# Patient Record
Sex: Male | Born: 1995 | Race: White | Hispanic: No | Marital: Single | Smoking: Never smoker
Health system: Southern US, Community
[De-identification: ages and names within clinical notes are randomized; demographics above are authoritative.]

## PROBLEM LIST (undated history)

## (undated) DIAGNOSIS — F840 Autistic disorder: Secondary | ICD-10-CM

---

## 2018-07-16 ENCOUNTER — Inpatient Hospital Stay (HOSPITAL_COMMUNITY)
Admission: EM | Admit: 2018-07-16 | Discharge: 2018-07-20 | DRG: 071 | Disposition: A | Discharge status: Home / Self Care | Payer: Medicare Other | Attending: Internal Medicine | Admitting: Internal Medicine

## 2018-07-16 ENCOUNTER — Observation Stay (HOSPITAL_COMMUNITY): Payer: Medicare Other

## 2018-07-16 ENCOUNTER — Emergency Department (HOSPITAL_COMMUNITY): Payer: Medicare Other

## 2018-07-16 ENCOUNTER — Encounter (HOSPITAL_COMMUNITY): Payer: Self-pay

## 2018-07-16 ENCOUNTER — Other Ambulatory Visit: Payer: Self-pay

## 2018-07-16 DIAGNOSIS — Q02 Microcephaly: Secondary | ICD-10-CM

## 2018-07-16 DIAGNOSIS — R1310 Dysphagia, unspecified: Secondary | ICD-10-CM | POA: Diagnosis present

## 2018-07-16 DIAGNOSIS — G9341 Metabolic encephalopathy: Principal | ICD-10-CM | POA: Diagnosis present

## 2018-07-16 DIAGNOSIS — R740 Nonspecific elevation of levels of transaminase and lactic acid dehydrogenase [LDH]: Secondary | ICD-10-CM

## 2018-07-16 DIAGNOSIS — J45909 Unspecified asthma, uncomplicated: Secondary | ICD-10-CM | POA: Diagnosis present

## 2018-07-16 DIAGNOSIS — R0600 Dyspnea, unspecified: Secondary | ICD-10-CM | POA: Diagnosis not present

## 2018-07-16 DIAGNOSIS — K760 Fatty (change of) liver, not elsewhere classified: Secondary | ICD-10-CM | POA: Diagnosis present

## 2018-07-16 DIAGNOSIS — R1084 Generalized abdominal pain: Secondary | ICD-10-CM

## 2018-07-16 DIAGNOSIS — R4182 Altered mental status, unspecified: Secondary | ICD-10-CM

## 2018-07-16 DIAGNOSIS — E039 Hypothyroidism, unspecified: Secondary | ICD-10-CM | POA: Diagnosis present

## 2018-07-16 DIAGNOSIS — F909 Attention-deficit hyperactivity disorder, unspecified type: Secondary | ICD-10-CM | POA: Diagnosis present

## 2018-07-16 DIAGNOSIS — R945 Abnormal results of liver function studies: Secondary | ICD-10-CM

## 2018-07-16 DIAGNOSIS — E538 Deficiency of other specified B group vitamins: Secondary | ICD-10-CM | POA: Diagnosis present

## 2018-07-16 DIAGNOSIS — F840 Autistic disorder: Secondary | ICD-10-CM | POA: Diagnosis present

## 2018-07-16 DIAGNOSIS — R7401 Elevation of levels of liver transaminase levels: Secondary | ICD-10-CM

## 2018-07-16 DIAGNOSIS — K5900 Constipation, unspecified: Secondary | ICD-10-CM | POA: Diagnosis present

## 2018-07-16 DIAGNOSIS — F419 Anxiety disorder, unspecified: Secondary | ICD-10-CM | POA: Diagnosis present

## 2018-07-16 DIAGNOSIS — D72829 Elevated white blood cell count, unspecified: Secondary | ICD-10-CM | POA: Diagnosis present

## 2018-07-16 DIAGNOSIS — F329 Major depressive disorder, single episode, unspecified: Secondary | ICD-10-CM | POA: Diagnosis present

## 2018-07-16 DIAGNOSIS — Z79899 Other long term (current) drug therapy: Secondary | ICD-10-CM

## 2018-07-16 DIAGNOSIS — R7989 Other specified abnormal findings of blood chemistry: Secondary | ICD-10-CM

## 2018-07-16 DIAGNOSIS — R109 Unspecified abdominal pain: Secondary | ICD-10-CM

## 2018-07-16 HISTORY — DX: Autistic disorder: F84.0

## 2018-07-16 LAB — CBC WITH DIFFERENTIAL/PLATELET
BASOS PCT: 0 %
Basophils Absolute: 0.1 10*3/uL (ref 0.0–0.1)
EOS ABS: 0 10*3/uL (ref 0.0–0.7)
EOS PCT: 0 %
HCT: 49.5 % (ref 39.0–52.0)
HEMOGLOBIN: 17 g/dL (ref 13.0–17.0)
Lymphocytes Relative: 25 %
Lymphs Abs: 3.3 10*3/uL (ref 0.7–4.0)
MCH: 31.8 pg (ref 26.0–34.0)
MCHC: 34.3 g/dL (ref 30.0–36.0)
MCV: 92.5 fL (ref 78.0–100.0)
MONO ABS: 0.8 10*3/uL (ref 0.1–1.0)
MONOS PCT: 6 %
NEUTROS PCT: 69 %
Neutro Abs: 9.2 10*3/uL — ABNORMAL HIGH (ref 1.7–7.7)
PLATELETS: 255 10*3/uL (ref 150–400)
RBC: 5.35 MIL/uL (ref 4.22–5.81)
RDW: 12.3 % (ref 11.5–15.5)
WBC: 13.4 10*3/uL — ABNORMAL HIGH (ref 4.0–10.5)

## 2018-07-16 LAB — COMPREHENSIVE METABOLIC PANEL
ALBUMIN: 4.5 g/dL (ref 3.5–5.0)
ALT: 106 U/L — ABNORMAL HIGH (ref 0–44)
ANION GAP: 12 (ref 5–15)
AST: 69 U/L — ABNORMAL HIGH (ref 15–41)
Alkaline Phosphatase: 63 U/L (ref 38–126)
BUN: 10 mg/dL (ref 6–20)
CHLORIDE: 108 mmol/L (ref 98–111)
CO2: 22 mmol/L (ref 22–32)
Calcium: 9.6 mg/dL (ref 8.9–10.3)
Creatinine, Ser: 0.95 mg/dL (ref 0.61–1.24)
GFR calc non Af Amer: >60 mL/min (ref >60)
GLUCOSE: 86 mg/dL (ref 70–99)
POTASSIUM: 3.7 mmol/L (ref 3.5–5.1)
SODIUM: 142 mmol/L (ref 135–145)
Total Bilirubin: 0.9 mg/dL (ref 0.3–1.2)
Total Protein: 8.3 g/dL — ABNORMAL HIGH (ref 6.5–8.1)

## 2018-07-16 LAB — URINALYSIS, ROUTINE W REFLEX MICROSCOPIC
Bilirubin Urine: NEGATIVE
Glucose, UA: NEGATIVE mg/dL
Hgb urine dipstick: NEGATIVE
KETONES UR: 5 mg/dL — AB
Leukocytes, UA: NEGATIVE
NITRITE: NEGATIVE
PH: 7 (ref 5.0–8.0)
PROTEIN: NEGATIVE mg/dL
Specific Gravity, Urine: 1.038 — ABNORMAL HIGH (ref 1.005–1.030)

## 2018-07-16 LAB — RAPID URINE DRUG SCREEN, HOSP PERFORMED
Amphetamines: NOT DETECTED
BENZODIAZEPINES: NOT DETECTED
Barbiturates: NOT DETECTED
COCAINE: NOT DETECTED
OPIATES: NOT DETECTED
TETRAHYDROCANNABINOL: NOT DETECTED

## 2018-07-16 LAB — TSH: TSH: 5.457 u[IU]/mL — AB (ref 0.350–4.500)

## 2018-07-16 LAB — VITAMIN B12: Vitamin B-12: 305 pg/mL (ref 180–914)

## 2018-07-16 LAB — LIPASE, BLOOD: Lipase: 27 U/L (ref 11–51)

## 2018-07-16 LAB — ETHANOL: Alcohol, Ethyl (B): <10 mg/dL (ref <10)

## 2018-07-16 LAB — AMMONIA: Ammonia: 12 umol/L (ref 9–35)

## 2018-07-16 MED ORDER — IOPAMIDOL (ISOVUE-300) INJECTION 61%
100.0000 mL | Freq: Once | INTRAVENOUS | Status: AC | PRN
  Administered 2018-07-16: 100 mL via INTRAVENOUS

## 2018-07-16 MED ORDER — FLUVOXAMINE MALEATE 50 MG PO TABS
50.0000 mg | ORAL_TABLET | Freq: Every day | ORAL | Status: DC
  Administered 2018-07-17 – 2018-07-20 (×4): 50 mg via ORAL
  Filled 2018-07-16 (×5): qty 1

## 2018-07-16 MED ORDER — GUANFACINE HCL ER 1 MG PO TB24
4.0000 mg | ORAL_TABLET | Freq: Every day | ORAL | Status: DC
  Administered 2018-07-17 – 2018-07-20 (×4): 4 mg via ORAL
  Filled 2018-07-16 (×5): qty 4

## 2018-07-16 MED ORDER — ARIPIPRAZOLE 5 MG PO TABS: 5.0000 mg | ORAL_TABLET | Freq: Every evening | ORAL | Status: DC

## 2018-07-16 MED ORDER — CYANOCOBALAMIN 1000 MCG/ML IJ SOLN
1000.0000 ug | Freq: Once | INTRAMUSCULAR | Status: AC
  Administered 2018-07-17: 1000 ug via INTRAMUSCULAR
  Filled 2018-07-16: qty 1

## 2018-07-16 MED ORDER — IOPAMIDOL (ISOVUE-300) INJECTION 61%
INTRAVENOUS | Status: AC
  Filled 2018-07-16: qty 100

## 2018-07-16 MED ORDER — VITAMIN B-12 1000 MCG PO TABS
2000.0000 ug | ORAL_TABLET | Freq: Every day | ORAL | Status: DC
  Administered 2018-07-17 – 2018-07-20 (×4): 2000 ug via ORAL
  Filled 2018-07-16 (×5): qty 2

## 2018-07-16 MED ORDER — SODIUM CHLORIDE 0.9 % IV BOLUS
1000.0000 mL | Freq: Once | INTRAVENOUS | Status: AC
  Administered 2018-07-16: 1000 mL via INTRAVENOUS

## 2018-07-16 NOTE — ED Notes (Signed)
Bed: WA17 Expected date:  Expected time:  Means of arrival:  Comments: Hold for TCU pt

## 2018-07-16 NOTE — ED Notes (Signed)
IV Ultrasound requested, Charge RN at bedside.

## 2018-07-16 NOTE — ED Provider Notes (Addendum)
[facility]-EMERGENCY DEPT Provider Note   CSN: [id number] Arrival date & time: 07/16/18  1508     History   Chief Complaint Chief Complaint  Patient presents with  . Constipation    HPI Nicholas Beard is a 22 y.o. male who presents AMS. PMH significant for Autism, absence seizures. He is with a caretaker at his group home and she says that he has been with them for about a year and he is typically very high functioning and is talkative. This morning he was acutely altered. His psych medicine was changed from Rexulti to Abilify in early June. He has had a decreased appetite since then. Yesterday and today he has become less responsive and wasn't answering staff's questions which he usually can do. The patient states that he feels "backed up". He denies hx of constipation or prior abdominal surgeries. He states his head hurts too. No known fevers. EMS was concerned for rectal FB but also noted his "eyes were jumping". His grandmother is POA.  LEVEL 5 CAVEAT due to AMS.  HPI  History reviewed. No pertinent past medical history.  There are no active problems to display for this patient.   History reviewed. No pertinent surgical history.      Home Medications    Prior to Admission medications   Medication Sig Start Date End Date Taking? Authorizing Provider  ARIPiprazole (ABILIFY) 5 MG tablet Take 5 mg by mouth every evening.   Yes [provider]  fluvoxaMINE (LUVOX) 50 MG tablet Take 50 mg by mouth daily.   Yes [provider]  guanFACINE (INTUNIV) 4 MG TB24 ER tablet Take 4 mg by mouth daily.   Yes [provider]    Family History History reviewed. No pertinent family history.  Social History Social History   Tobacco Use  . Smoking status: Never Smoker  . Smokeless tobacco: Never Used  Substance Use Topics  . Alcohol use: Not on file  . Drug use: Not on file     Allergies   Other   Review of Systems Review  of Systems  Unable to perform ROS: Mental status change  Gastrointestinal: Positive for abdominal pain and constipation.     Physical Exam Updated Vital Signs BP (!) 147/91   Pulse 91   Temp 98.4 F (36.9 C) (Oral)   Resp 18   SpO2 98%   Physical Exam  Constitutional: He is oriented to person, place, and time. He appears well-developed and well-nourished. No distress.  Obese, answers yes or no questions  HENT:  Head: Normocephalic and atraumatic.  Eyes: Pupils are equal, round, and reactive to light. Conjunctivae are normal. Right eye exhibits no discharge. Left eye exhibits no discharge. No scleral icterus.  Neck: Normal range of motion.  Cardiovascular: Normal rate and regular rhythm.  Pulmonary/Chest: Effort normal and breath sounds normal. No respiratory distress.  Abdominal: Soft. Bowel sounds are normal. He exhibits no distension. There is no tenderness.  Neurological: He is alert and oriented to person, place, and time.  Skin: Skin is warm and dry.  Psychiatric: He has a normal mood and affect. His behavior is normal.  Nursing note and vitals reviewed.    ED Treatments / Results  Labs (all labs ordered are listed, but only abnormal results are displayed) Labs Reviewed  CBC WITH DIFFERENTIAL/PLATELET - Abnormal; Notable for the following components:      Result Value   WBC 13.4 (*)    Neutro Abs 9.2 (*)  All other components within normal limits  COMPREHENSIVE METABOLIC PANEL - Abnormal; Notable for the following components:   Total Protein 8.3 (*)    AST 69 (*)    ALT 106 (*)    All other components within normal limits  URINALYSIS, ROUTINE W REFLEX MICROSCOPIC - Abnormal; Notable for the following components:   Specific Gravity, Urine 1.038 (*)    Ketones, ur 5 (*)    All other components within normal limits  LIPASE, BLOOD  ETHANOL  AMMONIA  RAPID URINE DRUG SCREEN, HOSP PERFORMED    EKG None  Radiology Ct Head Wo Contrast  Result Date:  07/16/2018 CLINICAL DATA:  Altered level of consciousness.  Autism EXAM: CT HEAD WITHOUT CONTRAST TECHNIQUE: Contiguous axial images were obtained from the base of the skull through the vertex without intravenous contrast. COMPARISON:  None. FINDINGS: Brain: The ventricles are normal in size and configuration. There is no intracranial mass, hemorrhage, extra-axial fluid collection, or midline shift. Gray-white compartments appear normal. No acute infarct is evident. Vascular: No hyperdense vessel. No vascular calcifications are evident. Skull: The bony calvarium appears intact. Sinuses/Orbits: There is a small retention cyst in the anterior right maxillary antrum. Other paranasal sinuses which are visualized are clear. Orbits appear symmetric bilaterally. Other: Mastoid air cells are clear. IMPRESSION: Small retention cyst in the anterior right maxillary antrum. Study otherwise unremarkable. Electronically Signed   By: Bretta Bang III M.D.   On: 07/16/2018 19:01   Ct Abdomen Pelvis W Contrast  Result Date: 07/16/2018 CLINICAL DATA:  Abdominal discomfort EXAM: CT ABDOMEN AND PELVIS WITH CONTRAST TECHNIQUE: Multidetector CT imaging of the abdomen and pelvis was performed using the standard protocol following bolus administration of intravenous contrast. CONTRAST:  <See Chart> ISOVUE-300 IOPAMIDOL (ISOVUE-300) INJECTION 61% COMPARISON:  None. FINDINGS: Lower chest: Lung bases are clear. Hepatobiliary: There is hepatic steatosis. No focal liver lesions are evident. Gallbladder wall is not appreciably thickened. There is no biliary duct dilatation. Pancreas: There is no appreciable pancreatic mass or inflammatory focus. Spleen: No splenic lesions are evident. Adrenals/Urinary Tract: Adrenals bilaterally appear normal. Kidneys bilaterally show no evident mass or hydronephrosis on either side. There is no appreciable renal or ureteral calculus on either side. Urinary bladder is midline with wall thickness within  normal limits. Stomach/Bowel: There is no appreciable bowel wall or mesenteric thickening. No evident bowel obstruction. No free air or portal venous air. There is moderate stool in the colon. Colon is not distended with stool. Vascular/Lymphatic: No abdominal aortic aneurysm. No vascular lesions are evident. There is no appreciable adenopathy in the abdomen or pelvis. Reproductive: Prostate and seminal vesicles are normal in size and contour. No evident pelvic mass. Other: Appendix appears normal. There is no abscess or ascites appreciable in the abdomen or pelvis. There is a minimal ventral hernia containing only fat. Musculoskeletal: No blastic or lytic bone lesions. No intramuscular or abdominal wall lesions are evident. IMPRESSION: 1. No evident bowel wall thickening or bowel obstruction. No abscess in the abdomen pelvis. Appendix appears normal. 2.  Hepatic steatosis without focal liver lesion evident. 3.  No renal or ureteral calculus.  No hydronephrosis. 4.  Rather minimal ventral hernia containing only fat. Electronically Signed   By: Bretta Bang III M.D.   On: 07/16/2018 19:05    Procedures Procedures (including critical care time)  Medications Ordered in ED Medications  sodium chloride 0.9 % bolus 1,000 mL (0 mLs Intravenous Stopped 07/16/18 1900)  iopamidol (ISOVUE-300) 61 % injection 100 mL (  Intravenous Canceled Entry 07/16/18 1847)     Initial Impression / Assessment and Plan / ED Course  I have reviewed the triage vital signs and the nursing notes.  Pertinent labs & imaging results that were available during my care of the patient were reviewed by me and considered in my medical decision making (see chart for details).  22 year old male presents with acute encephalopathy of unknown etiology. Vitals are normal. He is responsive to yes/no questions but doesn't elaborate and he has difficulty expressing why he is here. His caregiver states this is very abnormal. He does say he  feels constipated. Will obtain labs, UA, CT abdomen/pelvis.  Grandmother is at bedside and is now saying that the patient started acting different after he "smelled a weird smell" and she is concerned about possible seizure vs brain tumor. Multiple family members have passed from a brain tumor, and the youngest was his mother at age 52. Will expand differential and add CT head, UDS, etoh, etc  8:31 PM CT abdomen/pelvis shows fatty liver and moderate constipation. CT head shows right maxillary cyst but is otherwise normal. CBC is remarkable for mild leukocytosis of 13.4. CMP is remarkable for mild elevation of transaminases. Ammonia is normal. UA has 5 ketones and high specific gravity. Shared visit with Dr. Silverio Lay. Discussed with Dr. Selena Batten with [facility] who will come to see the patient. He requests neurology input.  9:33 PM Discussed with Dr. Otelia Limes who will consult    Final Clinical Impressions(s) / ED Diagnoses   Final diagnoses:  Generalized abdominal pain  Altered mental status, unspecified altered mental status type    ED Discharge Orders    None          Bethel Born, PA-C 07/16/18 2135    Charlynne Pander, MD 07/16/18 2322

## 2018-07-16 NOTE — ED Notes (Signed)
Xray arrived to room as it was time to transport patient, will go to xray and then transport to floor

## 2018-07-16 NOTE — H&P (Signed)
[facility] H&P   Patient Demographics:    Barret Esquivel, is a 22 y.o. male  MRN: [id number]   DOB - 11/27/1996  Admit Date - 07/16/2018  Outpatient Primary MD for the patient is Lin Landsman, MD  Referring MD/NP/PA:  Janetta Hora  Outpatient Specialists:     Patient coming from:   Chief Complaint  Patient presents with  . Constipation      HPI:    Charvez Voorhies  is a 22 y.o. male,  w autism apparently has been slow to respond to group home staff today.  His grandmother didn't feel like he was acting quite normal.  He has had his Rixulti change to Abilify recently .  Pt noted slight abdominal discomfort in the right upper and left mid side. Also noted slight dyspnea and has hx of asthma but his pox 98% on RA, and no wheezing.    Pt denies headache, tick bite, cough, cp, palp, n/v, diarrhea, brbpr, black stool, dysuria, hematuria.  Pt denies focal weakness, numbness, tingling.   In Ed,  CT brain, abd/ pelvis IMPRESSION: Small retention cyst in the anterior right maxillary antrum. Study otherwise unremarkable.  IMPRESSION: 1. No evident bowel wall thickening or bowel obstruction. No abscess in the abdomen pelvis. Appendix appears normal.  2.  Hepatic steatosis without focal liver lesion evident.  3.  No renal or ureteral calculus.  No hydronephrosis.  4.  Rather minimal ventral hernia containing only fat.  Wbc 13.4, hgb 17.0, Plt 255 Na 142, K 3.7  Ast 69, Alt 106, Alk phos 63, T. Bili 0.9  Lipase 27  Urinalysis negative  Etoh <10 UDS negative  Ammonia 12  B12 305 TSH 5.457  Ed requested neurology consult, appreciate input  Pt will be admitted for AMS    Review of systems:    In addition to the HPI above,  No Fever-chills, No Headache, No changes with Vision or hearing, No problems swallowing food or Liquids, No Chest pain, No Cough No  Abdominal pain, No Nausea or Vommitting, Bowel movements are regular, No Blood in stool or Urine, No dysuria, No new skin rashes or bruises, No new joints pains-aches,  No new weakness, tingling, numbness in any extremity, No recent weight gain or loss, No polyuria, polydypsia or polyphagia, No significant Mental Stressors.  A full 10 point Review of Systems was done, except as stated above, all other Review of Systems were negative.   With Past History of the following :    Past Medical History:  Diagnosis Date  . Autism spectrum disorder       History reviewed. No pertinent surgical history.    Social History:     Social History   Tobacco Use  . Smoking status: Never Smoker  . Smokeless tobacco: Never Used  Substance Use Topics  . Alcohol use: Never    Frequency: Never  Lives - at group home  Mobility - walks by self     Family History :     Family History  Problem Relation Age of Onset  . Brain cancer Mother        Home Medications:   Prior to Admission medications   Medication Sig Start Date End Date Taking? Authorizing Provider  ARIPiprazole (ABILIFY) 5 MG tablet Take 5 mg by mouth every evening.   Yes [provider]  fluvoxaMINE (LUVOX) 50 MG tablet Take 50 mg by mouth daily.   Yes [provider]  guanFACINE (INTUNIV) 4 MG TB24 ER tablet Take 4 mg by mouth daily.   Yes [provider]     Allergies:     Allergies  Allergen Reactions  . Other Hives    Bug  bites     Physical Exam:   Vitals  Blood pressure (!) 145/67, pulse 84, temperature 97.9 F (36.6 C), temperature source Oral, resp. rate 13, SpO2 97 %.   1. General  lying in bed in NAD,    2. Normal affect and insight, Not Suicidal or Homicidal, Awake Alert, Oriented X 3.  3. No F.N deficits, ALL C.Nerves Intact, Strength 5/5 all 4 extremities, Sensation intact all 4 extremities, Plantars down going.  No Clonus  4. Ears and Eyes appear Normal,  Conjunctivae clear, PERRLA. Moist Oral Mucosa.  5. Supple Neck, No JVD, No cervical lymphadenopathy appriciated, No Carotid Bruits.  6. Symmetrical Chest wall movement, Good air movement bilaterally, CTAB.  7. RRR, No Gallops, Rubs or Murmurs, No Parasternal Heave.  8. Positive Bowel Sounds, Abdomen Soft, No tenderness, No organomegaly appriciated,No rebound -guarding or rigidity.  9.  No Cyanosis, Normal Skin Turgor, No Skin Rash or Bruise.  10. Good muscle tone,  joints appear normal , no effusions, Normal ROM.  11. No Palpable Lymph Nodes in Neck or Axillae  ? Feet shake when doing babinski but no clonus    Data Review:    CBC Recent Labs  Lab 07/16/18 1730  WBC 13.4*  HGB 17.0  HCT 49.5  PLT 255  MCV 92.5  MCH 31.8  MCHC 34.3  RDW 12.3  LYMPHSABS 3.3  MONOABS 0.8  EOSABS 0.0  BASOSABS 0.1   ------------------------------------------------------------------------------------------------------------------  Chemistries  Recent Labs  Lab 07/16/18 1730  NA 142  K 3.7  CL 108  CO2 22  GLUCOSE 86  BUN 10  CREATININE 0.95  CALCIUM 9.6  AST 69*  ALT 106*  ALKPHOS 63  BILITOT 0.9   ------------------------------------------------------------------------------------------------------------------ CrCl cannot be calculated (Unknown ideal weight.). ------------------------------------------------------------------------------------------------------------------ Recent Labs    07/16/18 2047  TSH 5.457*    Coagulation profile No results for input(s): INR, PROTIME in the last 168 hours. ------------------------------------------------------------------------------------------------------------------- No results for input(s): DDIMER in the last 72 hours. -------------------------------------------------------------------------------------------------------------------  Cardiac Enzymes No results for input(s): CKMB, TROPONINI, MYOGLOBIN in the last 168  hours.  Invalid input(s): CK ------------------------------------------------------------------------------------------------------------------ No results found for: BNP   ---------------------------------------------------------------------------------------------------------------  Urinalysis    Component Value Date/Time   COLORURINE YELLOW 07/16/2018 [place] 07/16/2018 1730   LABSPEC 1.038 (H) 07/16/2018 1730   PHURINE 7.0 07/16/2018 1730   GLUCOSEU NEGATIVE 07/16/2018 1730   HGBUR NEGATIVE 07/16/2018 1730   BILIRUBINUR NEGATIVE 07/16/2018 1730   KETONESUR 5 (A) 07/16/2018 1730   PROTEINUR NEGATIVE 07/16/2018 1730   NITRITE NEGATIVE 07/16/2018 1730   LEUKOCYTESUR NEGATIVE 07/16/2018 1730    ----------------------------------------------------------------------------------------------------------------   Imaging Results:    Ct Head  Wo Contrast  Result Date: 07/16/2018 CLINICAL DATA:  Altered level of consciousness.  Autism EXAM: CT HEAD WITHOUT CONTRAST TECHNIQUE: Contiguous axial images were obtained from the base of the skull through the vertex without intravenous contrast. COMPARISON:  None. FINDINGS: Brain: The ventricles are normal in size and configuration. There is no intracranial mass, hemorrhage, extra-axial fluid collection, or midline shift. Gray-white compartments appear normal. No acute infarct is evident. Vascular: No hyperdense vessel. No vascular calcifications are evident. Skull: The bony calvarium appears intact. Sinuses/Orbits: There is a small retention cyst in the anterior right maxillary antrum. Other paranasal sinuses which are visualized are clear. Orbits appear symmetric bilaterally. Other: Mastoid air cells are clear. IMPRESSION: Small retention cyst in the anterior right maxillary antrum. Study otherwise unremarkable. Electronically Signed   By: Lowella Grip III M.D.   On: 07/16/2018 19:01   Ct Abdomen Pelvis W Contrast  Result  Date: 07/16/2018 CLINICAL DATA:  Abdominal discomfort EXAM: CT ABDOMEN AND PELVIS WITH CONTRAST TECHNIQUE: Multidetector CT imaging of the abdomen and pelvis was performed using the standard protocol following bolus administration of intravenous contrast. CONTRAST:  <See Chart> ISOVUE-300 IOPAMIDOL (ISOVUE-300) INJECTION 61% COMPARISON:  None. FINDINGS: Lower chest: Lung bases are clear. Hepatobiliary: There is hepatic steatosis. No focal liver lesions are evident. Gallbladder wall is not appreciably thickened. There is no biliary duct dilatation. Pancreas: There is no appreciable pancreatic mass or inflammatory focus. Spleen: No splenic lesions are evident. Adrenals/Urinary Tract: Adrenals bilaterally appear normal. Kidneys bilaterally show no evident mass or hydronephrosis on either side. There is no appreciable renal or ureteral calculus on either side. Urinary bladder is midline with wall thickness within normal limits. Stomach/Bowel: There is no appreciable bowel wall or mesenteric thickening. No evident bowel obstruction. No free air or portal venous air. There is moderate stool in the colon. Colon is not distended with stool. Vascular/Lymphatic: No abdominal aortic aneurysm. No vascular lesions are evident. There is no appreciable adenopathy in the abdomen or pelvis. Reproductive: Prostate and seminal vesicles are normal in size and contour. No evident pelvic mass. Other: Appendix appears normal. There is no abscess or ascites appreciable in the abdomen or pelvis. There is a minimal ventral hernia containing only fat. Musculoskeletal: No blastic or lytic bone lesions. No intramuscular or abdominal wall lesions are evident. IMPRESSION: 1. No evident bowel wall thickening or bowel obstruction. No abscess in the abdomen pelvis. Appendix appears normal. 2.  Hepatic steatosis without focal liver lesion evident. 3.  No renal or ureteral calculus.  No hydronephrosis. 4.  Rather minimal ventral hernia containing only  fat. Electronically Signed   By: Lowella Grip III M.D.   On: 07/16/2018 19:05       Assessment & Plan:    Active Problems:   Altered mental status    AMS Check b12, folate, esr, ana, rpr, tsh Check MRI brain Check EEG r/o partial seizure Neurology consult appreciated  Autism Continue abilify, luvox, intuniv for now Please consult psychiatry in the AM and ask regarding input on whether switching from [place] to Abilify in June 2019 could have contributed to his Altered Mental Status   Abnormal liver function ? Fatty liver Consider check ferritin, iron, tibc, ceruloplasmin, alpha 1 antitrypsin, ana , antismooth muscle ab, antimitochrondrial ab, and GI consultation as outpatient Note wilsons disease can cause change in personality / AMS but is more commonly found in women   Probable b12 deficiency Start vitamin b12 1058mcrograms im qmonth Start vitamin b12 20085mrograms po qday  Subclinical hypothyroidism Please have pcp follow up with recheck of TSH in 78month   DVT Prophylaxis  Lovenox - SCDs  AM Labs Ordered, also please review Full Orders  Family Communication: Admission, patients condition and plan of care including tests being ordered have been discussed with the patient  who indicate understanding and agree with the plan and Code Status.  Code Status  FULL CODE  Likely DC to  home  Condition GUARDED   Consults called: none  Admission status: observation  Time spent in minutes : 60   JJani GravelM.D on 07/16/2018 at 10:13 PM  Between 7am to 7pm - Pager - [phone]  After 7pm go to [email or web address] - password [email or web address] - Office  [phone]

## 2018-07-16 NOTE — Progress Notes (Signed)
[facility] to perform MRI r/t weight limit.  [facility] to see if patient will be transported to MRI tonight

## 2018-07-16 NOTE — ED Notes (Signed)
ED TO INPATIENT HANDOFF REPORT  Name/Age/Gender Nicholas Beard 22 y.o. male  Code Status   Home/SNF/Other group home  Chief Complaint behavioral  Level of Care/Admitting Diagnosis ED Disposition    ED Disposition Condition [facility] Area: [place] [100102]  Level of Care: Telemetry [5]  Admit to tele based on following criteria: Monitor for Ischemic changes  Diagnosis: Altered mental status [780.97.ICD-9-CM]  Admitting Physician: Jani Gravel [[id number]]  Attending Physician: Jani Gravel [[id number]]  PT Class (Do Not Modify): Observation [104]  PT Acc Code (Do Not Modify): Observation [10022]       Medical History Past Medical History:  Diagnosis Date  . Autism spectrum disorder     Allergies Allergies  Allergen Reactions  . Other Hives    Bug  bites    IV Location/Drains/Wounds Patient Lines/Drains/Airways Status   Active Line/Drains/Airways    Name:   Placement date:   Placement time:   Site:   Days:   Peripheral IV 07/16/18 Right Antecubital   07/16/18    1730    Antecubital   less than 1          Labs/Imaging Results for orders placed or performed during the hospital encounter of 07/16/18 (from the past 48 hour(s))  CBC with Differential     Status: Abnormal   Collection Time: 07/16/18  5:30 PM  Result Value Ref Range   WBC 13.4 (H) 4.0 - 10.5 K/uL   RBC 5.35 4.22 - 5.81 MIL/uL   Hemoglobin 17.0 13.0 - 17.0 g/dL   HCT 49.5 39.0 - 52.0 %   MCV 92.5 78.0 - 100.0 fL   MCH 31.8 26.0 - 34.0 pg   MCHC 34.3 30.0 - 36.0 g/dL   RDW 12.3 11.5 - 15.5 %   Platelets 255 150 - 400 K/uL   Neutrophils Relative % 69 %   Neutro Abs 9.2 (H) 1.7 - 7.7 K/uL   Lymphocytes Relative 25 %   Lymphs Abs 3.3 0.7 - 4.0 K/uL   Monocytes Relative 6 %   Monocytes Absolute 0.8 0.1 - 1.0 K/uL   Eosinophils Relative 0 %   Eosinophils Absolute 0.0 0.0 - 0.7 K/uL   Basophils Relative 0 %   Basophils Absolute 0.1 0.0 - 0.1 K/uL    Comment:  Performed at [facility], [place] [address] [place], [place] [address]  Comprehensive metabolic panel     Status: Abnormal   Collection Time: 07/16/18  5:30 PM  Result Value Ref Range   Sodium 142 135 - 145 mmol/L   Potassium 3.7 3.5 - 5.1 mmol/L   Chloride 108 98 - 111 mmol/L    Comment: Please note change in reference range.   CO2 22 22 - 32 mmol/L   Glucose, Bld 86 70 - 99 mg/dL    Comment: Please note change in reference range.   BUN 10 6 - 20 mg/dL    Comment: Please note change in reference range.   Creatinine, Ser 0.95 0.61 - 1.24 mg/dL   Calcium 9.6 8.9 - 10.3 mg/dL   Total Protein 8.3 (H) 6.5 - 8.1 g/dL   Albumin 4.5 3.5 - 5.0 g/dL   AST 69 (H) 15 - 41 U/L   ALT 106 (H) 0 - 44 U/L    Comment: Please note change in reference range.   Alkaline Phosphatase 63 38 - 126 U/L   Total Bilirubin 0.9 0.3 - 1.2 mg/dL   GFR calc non Af  Amer >60 >60 mL/min   GFR calc Af Amer >60 >60 mL/min    Comment: (NOTE) The eGFR has been calculated using the CKD EPI equation. This calculation has not been validated in all clinical situations. eGFR's persistently <60 mL/min signify possible Chronic Kidney Disease.    Anion gap 12 5 - 15    Comment: Performed at [facility], [place] [address], [place], [place] [address]  Lipase, blood     Status: None   Collection Time: 07/16/18  5:30 PM  Result Value Ref Range   Lipase 27 11 - 51 U/L    Comment: Performed at [facility], [place] [address] [place], [place] [address]  Urinalysis, Routine w reflex microscopic     Status: Abnormal   Collection Time: 07/16/18  5:30 PM  Result Value Ref Range   Color, Urine YELLOW YELLOW   APPearance CLEAR CLEAR   Specific Gravity, Urine 1.038 (H) 1.005 - 1.030   pH 7.0 5.0 - 8.0   Glucose, UA NEGATIVE NEGATIVE mg/dL   Hgb urine dipstick NEGATIVE NEGATIVE   Bilirubin Urine NEGATIVE NEGATIVE   Ketones, ur 5 (A) NEGATIVE mg/dL   Protein, ur NEGATIVE NEGATIVE  mg/dL   Nitrite NEGATIVE NEGATIVE   Leukocytes, UA NEGATIVE NEGATIVE    Comment: Performed at [facility] [address], [place], [place] [address]  Ethanol     Status: None   Collection Time: 07/16/18  5:30 PM  Result Value Ref Range   Alcohol, Ethyl (B) <10 <10 mg/dL    Comment: (NOTE) Lowest detectable limit for serum alcohol is 10 mg/dL. For medical purposes only. Performed at [facility], [place] [address], [place], [place] [address]   Urine rapid drug screen (hosp performed)     Status: None   Collection Time: 07/16/18  7:27 PM  Result Value Ref Range   Opiates NONE DETECTED NONE DETECTED   Cocaine NONE DETECTED NONE DETECTED   Benzodiazepines NONE DETECTED NONE DETECTED   Amphetamines NONE DETECTED NONE DETECTED   Tetrahydrocannabinol NONE DETECTED NONE DETECTED   Barbiturates NONE DETECTED NONE DETECTED    Comment: (NOTE) DRUG SCREEN FOR MEDICAL PURPOSES ONLY.  IF CONFIRMATION IS NEEDED FOR ANY PURPOSE, NOTIFY LAB WITHIN 5 DAYS. LOWEST DETECTABLE LIMITS FOR URINE DRUG SCREEN Drug Class                     Cutoff (ng/mL) Amphetamine and metabolites    1000 Barbiturate and metabolites    200 Benzodiazepine                 300 Tricyclics and metabolites     300 Opiates and metabolites        300 Cocaine and metabolites        300 THC                            50 Performed at [facility], [place] [address] [place], [place] [address]   Ammonia     Status: None   Collection Time: 07/16/18  7:32 PM  Result Value Ref Range   Ammonia 12 9 - 35 umol/L    Comment: Performed at [facility], [place] [address] [place], [place] [address]  Vitamin B12     Status: None   Collection Time: 07/16/18  8:47 PM  Result Value Ref Range   Vitamin B-12 305 180 -  914 pg/mL    Comment: (NOTE) This assay is not validated for testing neonatal or myeloproliferative syndrome specimens for Vitamin B12  levels. Performed at [facility], [place] [address], [place], [place] [address]   TSH     Status: Abnormal   Collection Time: 07/16/18  8:47 PM  Result Value Ref Range   TSH 5.457 (H) 0.350 - 4.500 uIU/mL    Comment: Performed by a 3rd Generation assay with a functional sensitivity of <=0.01 uIU/mL. Performed at [facility], [place] [address], [place], [place] [address]    Ct Head Wo Contrast  Result Date: 07/16/2018 CLINICAL DATA:  Altered level of consciousness.  Autism EXAM: CT HEAD WITHOUT CONTRAST TECHNIQUE: Contiguous axial images were obtained from the base of the skull through the vertex without intravenous contrast. COMPARISON:  None. FINDINGS: Brain: The ventricles are normal in size and configuration. There is no intracranial mass, hemorrhage, extra-axial fluid collection, or midline shift. Gray-white compartments appear normal. No acute infarct is evident. Vascular: No hyperdense vessel. No vascular calcifications are evident. Skull: The bony calvarium appears intact. Sinuses/Orbits: There is a small retention cyst in the anterior right maxillary antrum. Other paranasal sinuses which are visualized are clear. Orbits appear symmetric bilaterally. Other: Mastoid air cells are clear. IMPRESSION: Small retention cyst in the anterior right maxillary antrum. Study otherwise unremarkable. Electronically Signed   By: Lowella Grip III M.D.   On: 07/16/2018 19:01   Ct Abdomen Pelvis W Contrast  Result Date: 07/16/2018 CLINICAL DATA:  Abdominal discomfort EXAM: CT ABDOMEN AND PELVIS WITH CONTRAST TECHNIQUE: Multidetector CT imaging of the abdomen and pelvis was performed using the standard protocol following bolus administration of intravenous contrast. CONTRAST:  <See Chart> ISOVUE-300 IOPAMIDOL (ISOVUE-300) INJECTION 61% COMPARISON:  None. FINDINGS: Lower chest: Lung bases are clear. Hepatobiliary: There is hepatic steatosis. No focal liver lesions are  evident. Gallbladder wall is not appreciably thickened. There is no biliary duct dilatation. Pancreas: There is no appreciable pancreatic mass or inflammatory focus. Spleen: No splenic lesions are evident. Adrenals/Urinary Tract: Adrenals bilaterally appear normal. Kidneys bilaterally show no evident mass or hydronephrosis on either side. There is no appreciable renal or ureteral calculus on either side. Urinary bladder is midline with wall thickness within normal limits. Stomach/Bowel: There is no appreciable bowel wall or mesenteric thickening. No evident bowel obstruction. No free air or portal venous air. There is moderate stool in the colon. Colon is not distended with stool. Vascular/Lymphatic: No abdominal aortic aneurysm. No vascular lesions are evident. There is no appreciable adenopathy in the abdomen or pelvis. Reproductive: Prostate and seminal vesicles are normal in size and contour. No evident pelvic mass. Other: Appendix appears normal. There is no abscess or ascites appreciable in the abdomen or pelvis. There is a minimal ventral hernia containing only fat. Musculoskeletal: No blastic or lytic bone lesions. No intramuscular or abdominal wall lesions are evident. IMPRESSION: 1. No evident bowel wall thickening or bowel obstruction. No abscess in the abdomen pelvis. Appendix appears normal. 2.  Hepatic steatosis without focal liver lesion evident. 3.  No renal or ureteral calculus.  No hydronephrosis. 4.  Rather minimal ventral hernia containing only fat. Electronically Signed   By: Lowella Grip III M.D.   On: 07/16/2018 19:05    Pending Labs Unresulted Labs (From admission, onward)   Start     Ordered   07/16/18 2048  RPR  Add-on,   R     07/16/18 2047   07/16/18 2048  HIV antibody (routine testing) (NOT for [facility])  Once,   R     07/16/18 2047   07/16/18 2048  Rapid HIV screen (HIV 1/2 Ab+Ag) ([place] Only)  Once,   R     07/16/18 2047   07/16/18 2048  Hepatitis panel, acute  Add-on,   R      07/16/18 2047   07/16/18 2047  Folate RBC  Add-on,   R     07/16/18 2047   07/16/18 2047  Sedimentation rate  Once,   R     07/16/18 2047      Vitals/Pain Today's Vitals   07/16/18 1854 07/16/18 1900 07/16/18 1930 07/16/18 2139  BP: 138/78 133/76 133/75 (!) 145/67  Pulse: 82 81 81 84  Resp: 19 11 (!) 28 13  Temp:    97.9 F (36.6 C)  TempSrc:    Oral  SpO2: 100% 100% 99% 97%  PainSc:        Isolation Precautions No active isolations  Medications Medications  sodium chloride 0.9 % bolus 1,000 mL (0 mLs Intravenous Stopped 07/16/18 1900)  iopamidol (ISOVUE-300) 61 % injection 100 mL ( Intravenous Canceled Entry 07/16/18 1847)    Mobility walks

## 2018-07-16 NOTE — ED Notes (Signed)
Patient transported to CT 

## 2018-07-16 NOTE — ED Triage Notes (Signed)
EMS reports from group home, initially called for possible breathing problem but on EMS arrival no respiratory distress apparent. EMS reports Pt is autistic, refusing to answer all questions asked. Pt then stated c/o constipation, states he has been going at weird times and feels backed up, asked EMS for something to help.Marland Kitchen. EMS reports Pt did not answer questions about BM appropriately and stated they feel that Pt may have possibly inserted something in rectum due to constipation and avoidance of questions, Pt also stated to EMS " how do you pop a stomach" further leading to prior possibility. Grandmother stated recent DX of brain tumor.  CBG 136

## 2018-07-16 NOTE — Progress Notes (Signed)
Patient has no diet order. Requested diet order from PCP on call.

## 2018-07-16 NOTE — ED Notes (Signed)
Pt is complaining of stomach pain. Mother is concerned of neurological disorder. ED Provider is aware.

## 2018-07-16 NOTE — ED Notes (Signed)
ED Provider at bedside. 

## 2018-07-17 ENCOUNTER — Ambulatory Visit (HOSPITAL_COMMUNITY): Payer: Medicare Other

## 2018-07-17 ENCOUNTER — Observation Stay (HOSPITAL_COMMUNITY): Payer: Medicare Other

## 2018-07-17 ENCOUNTER — Observation Stay (HOSPITAL_COMMUNITY)
Admit: 2018-07-17 | Discharge: 2018-07-17 | Disposition: A | Discharge status: Home / Self Care | Payer: Medicare Other | Attending: Internal Medicine | Admitting: Internal Medicine

## 2018-07-17 ENCOUNTER — Ambulatory Visit (HOSPITAL_COMMUNITY)
Admit: 2018-07-17 | Discharge: 2018-07-17 | Disposition: A | Discharge status: Home / Self Care | Payer: Medicare Other | Source: Ambulatory Visit | Attending: Internal Medicine | Admitting: Internal Medicine

## 2018-07-17 ENCOUNTER — Other Ambulatory Visit: Payer: Self-pay

## 2018-07-17 DIAGNOSIS — R404 Transient alteration of awareness: Secondary | ICD-10-CM | POA: Insufficient documentation

## 2018-07-17 DIAGNOSIS — F909 Attention-deficit hyperactivity disorder, unspecified type: Secondary | ICD-10-CM

## 2018-07-17 DIAGNOSIS — R4182 Altered mental status, unspecified: Secondary | ICD-10-CM | POA: Diagnosis not present

## 2018-07-17 DIAGNOSIS — G4700 Insomnia, unspecified: Secondary | ICD-10-CM

## 2018-07-17 DIAGNOSIS — F840 Autistic disorder: Secondary | ICD-10-CM | POA: Diagnosis not present

## 2018-07-17 LAB — CBC
HCT: 48.5 % (ref 39.0–52.0)
Hemoglobin: 16.1 g/dL (ref 13.0–17.0)
MCH: 31.3 pg (ref 26.0–34.0)
MCHC: 33.2 g/dL (ref 30.0–36.0)
MCV: 94.2 fL (ref 78.0–100.0)
Platelets: 219 10*3/uL (ref 150–400)
RBC: 5.15 MIL/uL (ref 4.22–5.81)
RDW: 12.4 % (ref 11.5–15.5)
WBC: 11.5 10*3/uL — ABNORMAL HIGH (ref 4.0–10.5)

## 2018-07-17 LAB — COMPREHENSIVE METABOLIC PANEL WITH GFR
ALT: 99 U/L — ABNORMAL HIGH (ref 0–44)
AST: 67 U/L — ABNORMAL HIGH (ref 15–41)
Albumin: 4.2 g/dL (ref 3.5–5.0)
Alkaline Phosphatase: 54 U/L (ref 38–126)
Anion gap: 8 (ref 5–15)
BUN: 10 mg/dL (ref 6–20)
CO2: 25 mmol/L (ref 22–32)
Calcium: 9.3 mg/dL (ref 8.9–10.3)
Chloride: 111 mmol/L (ref 98–111)
Creatinine, Ser: 0.84 mg/dL (ref 0.61–1.24)
GFR calc Af Amer: >60 mL/min
GFR calc non Af Amer: >60 mL/min
Glucose, Bld: 93 mg/dL (ref 70–99)
Potassium: 4.7 mmol/L (ref 3.5–5.1)
Sodium: 144 mmol/L (ref 135–145)
Total Bilirubin: 1.3 mg/dL — ABNORMAL HIGH (ref 0.3–1.2)
Total Protein: 7.3 g/dL (ref 6.5–8.1)

## 2018-07-17 LAB — HIV ANTIBODY (ROUTINE TESTING W REFLEX): HIV SCREEN 4TH GENERATION: NONREACTIVE

## 2018-07-17 LAB — RAPID HIV SCREEN (HIV 1/2 AB+AG)
HIV 1/2 Antibodies: NONREACTIVE
HIV-1 P24 Antigen - HIV24: NONREACTIVE

## 2018-07-17 LAB — SYPHILIS: RPR W/REFLEX TO RPR TITER AND TREPONEMAL ANTIBODIES, TRADITIONAL SCREENING AND DIAGNOSIS ALGORITHM: RPR Ser Ql: NONREACTIVE

## 2018-07-17 LAB — SEDIMENTATION RATE: SED RATE: 2 mm/h (ref 0–16)

## 2018-07-17 MED ORDER — CHLORHEXIDINE GLUCONATE CLOTH 2 % EX PADS: 6.0000 | MEDICATED_PAD | Freq: Every day | CUTANEOUS | Status: DC

## 2018-07-17 MED ORDER — MUPIROCIN 2 % EX OINT: 1.0000 "application " | TOPICAL_OINTMENT | Freq: Two times a day (BID) | CUTANEOUS | Status: DC

## 2018-07-17 MED ORDER — POLYETHYLENE GLYCOL 3350 17 G PO PACK
17.0000 g | PACK | Freq: Every day | ORAL | Status: DC
  Administered 2018-07-17 – 2018-07-19 (×3): 17 g via ORAL
  Filled 2018-07-17 (×3): qty 1

## 2018-07-17 MED ORDER — ARIPIPRAZOLE 2 MG PO TABS
2.0000 mg | ORAL_TABLET | Freq: Every evening | ORAL | Status: DC
  Administered 2018-07-17 – 2018-07-19 (×3): 2 mg via ORAL
  Filled 2018-07-17 (×5): qty 1

## 2018-07-17 MED ORDER — SENNOSIDES-DOCUSATE SODIUM 8.6-50 MG PO TABS
1.0000 | ORAL_TABLET | Freq: Two times a day (BID) | ORAL | Status: DC
  Administered 2018-07-17 – 2018-07-20 (×6): 1 via ORAL
  Filled 2018-07-17 (×6): qty 1

## 2018-07-17 NOTE — Progress Notes (Signed)
Routine EEG concerning sharply contoured wave in mid temporal region - possibly epileptogenic focus. I would like to obtain 24 hr Video EEG to see if patient has more discharges and try to get more information if his acute  presentation is from seizures.

## 2018-07-17 NOTE — Care Management Obs Status (Signed)
MEDICARE OBSERVATION STATUS NOTIFICATION   Patient Details  Name: Nicholas Beard MRN: [id number] Date of Birth: 11/24/1996   Medicare Observation Status Notification Given:  Yes    McGibboneyFelicity Coyer, Tivis Wherry, RN 07/17/2018, 10:47 AM

## 2018-07-17 NOTE — Progress Notes (Addendum)
[facility] brain between 5-6pm, patient and family made aware   [facility], [facility], contacted [facility] to make aware, spoke with Maisie Fushomas and set p/u for 5pm, family and patient updated

## 2018-07-17 NOTE — Consult Note (Addendum)
[place] [facility] Consult   Reason for Consult:  Depression  Referring Physician:  Dr. Posey Pronto  Patient Identification: Jatavious Peppard MRN:  [id number] Principal Diagnosis: Altered mental status Diagnosis:   Patient Active Problem List   Diagnosis Date Noted  . Altered mental status [R41.82] 07/16/2018    Total Time spent with patient: 1 hour  Subjective:   Aswad Wandrey is a 22 y.o. male patient admitted with altered mental status with subacute behavioral changes.  HPI:  Per chart review, patient was admitted with altered mental status with subacute behavioral changes. There is concern for atypical seizure involving possible olfactory hallucinations versus depression with psychosis. Patient reports onset of decreased appetite with "not feeling well" for the past 2 days. He reported smelling a sour odor that no one else could smell at his group home. He became more withdrawn with decreased verbal output. He also complained of constipation. A month ago, Rexulti was changed to Abilify. His appetite has decreased. He has a similar episode at 22 y/o with blanking staring and decreased verbal output. Concern was discontinued with resolution of his symptoms. He is high functioning and outgoing at baseline. BAL and UDS were negative on admission. Home medications include Intuniv 4 mg daily and Abilify 5 mg qhs. Abilify was decreased to 2 mg qhs since admission.   On interview, Rhys reports reports that he did not feel like himself 2 days prior to admission.  He reports feeling lethargic with poor sleep and poor appetite.  He also endorses constipation.  He reports smelling a foul odor that no one else could smell.  He denies feeling depressed or anxious.  He denies any recent stressors.  He reports that work is going well.  He gets along with everyone at his group home.  He reports that they are nice.  He denies SI, HI or AVH.  He denies a history of suicide attempts.  He reports  compliance with his medications.    Patient's mother and group home worker his room worker were spoken to separately.  His grandmother reports that he has a history of anxiety and autism but he is high functioning.  He is normally outgoing and well spoken.  She reports that he has had difficulty communicating.  He had a similar presentation at 22 y/o after he had a seizure.  He was taken off of Concerta due to concern that it lowered his seizure threshold.  The group home worker reports that he had been doing well prior to a couple of days ago.  She reports that he recently went to a surf camp and enjoys fishing.  On Friday, he came home from work and went to sleep.  On Saturday, he was not himself.  He reported that he smelled an odor and his eyes appeared to be "dancing" and "glaring."  His grandmother reports that Rexulti was changed to Abilify a month ago because it caused significant weight gain over a 1 year period.  She reports that he had "hand flapping" but this resolved when he was a child.  There is a strong family history in his mother and father for brain tumors.  Past Psychiatric History: Autism and anxiety  Risk to Self:  None. Denies SI. Risk to Others:  None. Denies HI.  Prior Inpatient Therapy:  He was hospitalized at 22 y/o.  Prior Outpatient Therapy:  He is followed by his PCP.   Past Medical History:  Past Medical History:  Diagnosis Date  . Autism spectrum  disorder    History reviewed. No pertinent surgical history. Family History:  Family History  Problem Relation Age of Onset  . Brain cancer Mother    Family Psychiatric  History: Mother and father-IDD.  Social History:  Social History   Substance and Sexual Activity  Alcohol Use Never  . Frequency: Never     Social History   Substance and Sexual Activity  Drug Use Not on file    Social History   Socioeconomic History  . Marital status: Single    Spouse name: Not on file  . Number of children: Not on file   . Years of education: Not on file  . Highest education level: Not on file  Occupational History  . Not on file  Social Needs  . Financial resource strain: Not on file  . Food insecurity:    Worry: Not on file    Inability: Not on file  . Transportation needs:    Medical: Not on file    Non-medical: Not on file  Tobacco Use  . Smoking status: Never Smoker  . Smokeless tobacco: Never Used  Substance and Sexual Activity  . Alcohol use: Never    Frequency: Never  . Drug use: Not on file  . Sexual activity: Not on file  Lifestyle  . Physical activity:    Days per week: Not on file    Minutes per session: Not on file  . Stress: Not on file  Relationships  . Social connections:    Talks on phone: Not on file    Gets together: Not on file    Attends religious service: Not on file    Active member of club or organization: Not on file    Attends meetings of clubs or organizations: Not on file    Relationship status: Not on file  Other Topics Concern  . Not on file  Social History Narrative  . Not on file   Additional Social History: He lives at a group home. He works at [employer]. He cleans condiments and tables. He denies illicit substance or alcohol use.     Allergies:   Allergies  Allergen Reactions  . Other Hives    Bug  bites    Labs:  Results for orders placed or performed during the hospital encounter of 07/16/18 (from the past 48 hour(s))  CBC with Differential     Status: Abnormal   Collection Time: 07/16/18  5:30 PM  Result Value Ref Range   WBC 13.4 (H) 4.0 - 10.5 K/uL   RBC 5.35 4.22 - 5.81 MIL/uL   Hemoglobin 17.0 13.0 - 17.0 g/dL   HCT 49.5 39.0 - 52.0 %   MCV 92.5 78.0 - 100.0 fL   MCH 31.8 26.0 - 34.0 pg   MCHC 34.3 30.0 - 36.0 g/dL   RDW 12.3 11.5 - 15.5 %   Platelets 255 150 - 400 K/uL   Neutrophils Relative % 69 %   Neutro Abs 9.2 (H) 1.7 - 7.7 K/uL   Lymphocytes Relative 25 %   Lymphs Abs 3.3 0.7 - 4.0 K/uL   Monocytes Relative 6 %    Monocytes Absolute 0.8 0.1 - 1.0 K/uL   Eosinophils Relative 0 %   Eosinophils Absolute 0.0 0.0 - 0.7 K/uL   Basophils Relative 0 %   Basophils Absolute 0.1 0.0 - 0.1 K/uL    Comment: Performed at [facility], [place] [address] [place], [place] [address]  Comprehensive metabolic panel  Status: Abnormal   Collection Time: 07/16/18  5:30 PM  Result Value Ref Range   Sodium 142 135 - 145 mmol/L   Potassium 3.7 3.5 - 5.1 mmol/L   Chloride 108 98 - 111 mmol/L    Comment: Please note change in reference range.   CO2 22 22 - 32 mmol/L   Glucose, Bld 86 70 - 99 mg/dL    Comment: Please note change in reference range.   BUN 10 6 - 20 mg/dL    Comment: Please note change in reference range.   Creatinine, Ser 0.95 0.61 - 1.24 mg/dL   Calcium 9.6 8.9 - 10.3 mg/dL   Total Protein 8.3 (H) 6.5 - 8.1 g/dL   Albumin 4.5 3.5 - 5.0 g/dL   AST 69 (H) 15 - 41 U/L   ALT 106 (H) 0 - 44 U/L    Comment: Please note change in reference range.   Alkaline Phosphatase 63 38 - 126 U/L   Total Bilirubin 0.9 0.3 - 1.2 mg/dL   GFR calc non Af Amer >60 >60 mL/min   GFR calc Af Amer >60 >60 mL/min    Comment: (NOTE) The eGFR has been calculated using the CKD EPI equation. This calculation has not been validated in all clinical situations. eGFR's persistently <60 mL/min signify possible Chronic Kidney Disease.    Anion gap 12 5 - 15    Comment: Performed at [facility], [place] [address] [place], [place] [address]  Lipase, blood     Status: None   Collection Time: 07/16/18  5:30 PM  Result Value Ref Range   Lipase 27 11 - 51 U/L    Comment: Performed at [facility], [place] [address], [place], [place] [address]  Urinalysis, Routine w reflex microscopic     Status: Abnormal   Collection Time: 07/16/18  5:30 PM  Result Value Ref Range   Color, Urine YELLOW YELLOW   APPearance CLEAR CLEAR   Specific Gravity, Urine 1.038 (H) 1.005 - 1.030   pH 7.0 5.0 -  8.0   Glucose, UA NEGATIVE NEGATIVE mg/dL   Hgb urine dipstick NEGATIVE NEGATIVE   Bilirubin Urine NEGATIVE NEGATIVE   Ketones, ur 5 (A) NEGATIVE mg/dL   Protein, ur NEGATIVE NEGATIVE mg/dL   Nitrite NEGATIVE NEGATIVE   Leukocytes, UA NEGATIVE NEGATIVE    Comment: Performed at [place] [address], [place], [place] [address]  Ethanol     Status: None   Collection Time: 07/16/18  5:30 PM  Result Value Ref Range   Alcohol, Ethyl (B) <10 <10 mg/dL    Comment: (NOTE) Lowest detectable limit for serum alcohol is 10 mg/dL. For medical purposes only. Performed at [facility], [place] [address] [place], [place] [address]   Urine rapid drug screen (hosp performed)     Status: None   Collection Time: 07/16/18  7:27 PM  Result Value Ref Range   Opiates NONE DETECTED NONE DETECTED   Cocaine NONE DETECTED NONE DETECTED   Benzodiazepines NONE DETECTED NONE DETECTED   Amphetamines NONE DETECTED NONE DETECTED   Tetrahydrocannabinol NONE DETECTED NONE DETECTED   Barbiturates NONE DETECTED NONE DETECTED    Comment: (NOTE) DRUG SCREEN FOR MEDICAL PURPOSES ONLY.  IF CONFIRMATION IS NEEDED FOR ANY PURPOSE, NOTIFY LAB WITHIN 5 DAYS. LOWEST DETECTABLE LIMITS FOR URINE DRUG SCREEN Drug Class  Cutoff (ng/mL) Amphetamine and metabolites    1000 Barbiturate and metabolites    200 Benzodiazepine                 144 Tricyclics and metabolites     300 Opiates and metabolites        300 Cocaine and metabolites        300 THC                            50 Performed at [facility], [place] [address], [place], [place] [address]   Ammonia     Status: None   Collection Time: 07/16/18  7:32 PM  Result Value Ref Range   Ammonia 12 9 - 35 umol/L    Comment: Performed at [facility], [place] [address] [place], [place] [address]  Vitamin B12     Status: None   Collection Time: 07/16/18  8:47 PM  Result  Value Ref Range   Vitamin B-12 305 180 - 914 pg/mL    Comment: (NOTE) This assay is not validated for testing neonatal or myeloproliferative syndrome specimens for Vitamin B12 levels. Performed at [facility], [place] [address] [place], [place] [address]   TSH     Status: Abnormal   Collection Time: 07/16/18  8:47 PM  Result Value Ref Range   TSH 5.457 (H) 0.350 - 4.500 uIU/mL    Comment: Performed by a 3rd Generation assay with a functional sensitivity of <=0.01 uIU/mL. Performed at [facility], [place] [address] [place], [place] [address]   Sedimentation rate     Status: None   Collection Time: 07/16/18 11:19 PM  Result Value Ref Range   Sed Rate 2 0 - 16 mm/hr    Comment: Performed at [facility], [place] [address], [place], [place] [address]  Rapid HIV screen (HIV 1/2 Ab+Ag) ([facility] Only)     Status: None   Collection Time: 07/16/18 11:19 PM  Result Value Ref Range   HIV-1 P24 Antigen - HIV24 NON REACTIVE NON REACTIVE   HIV 1/2 Antibodies NON REACTIVE NON REACTIVE   Interpretation (HIV Ag Ab)      A non reactive test result means that HIV 1 or HIV 2 antibodies and HIV 1 p24 antigen were not detected in the specimen.    Comment: Performed at [facility], [place] [address] [place], [place] [address]  CBC     Status: Abnormal   Collection Time: 07/17/18  4:24 AM  Result Value Ref Range   WBC 11.5 (H) 4.0 - 10.5 K/uL   RBC 5.15 4.22 - 5.81 MIL/uL   Hemoglobin 16.1 13.0 - 17.0 g/dL   HCT 48.5 39.0 - 52.0 %   MCV 94.2 78.0 - 100.0 fL   MCH 31.3 26.0 - 34.0 pg   MCHC 33.2 30.0 - 36.0 g/dL   RDW 12.4 11.5 - 15.5 %   Platelets 219 150 - 400 K/uL    Comment: Performed at [facility], [place] [address] [place], [place] [address]  Comprehensive metabolic panel     Status: Abnormal   Collection Time: 07/17/18  4:24 AM  Result Value Ref Range   Sodium 144 135 - 145 mmol/L   Potassium 4.7 3.5 -  5.1 mmol/L    Comment: DELTA CHECK NOTED SLIGHT HEMOLYSIS    Chloride 111 98 - 111 mmol/L    Comment:  Please note change in reference range.   CO2 25 22 - 32 mmol/L   Glucose, Bld 93 70 - 99 mg/dL    Comment: Please note change in reference range.   BUN 10 6 - 20 mg/dL    Comment: Please note change in reference range.   Creatinine, Ser 0.84 0.61 - 1.24 mg/dL   Calcium 9.3 8.9 - 10.3 mg/dL   Total Protein 7.3 6.5 - 8.1 g/dL   Albumin 4.2 3.5 - 5.0 g/dL   AST 67 (H) 15 - 41 U/L   ALT 99 (H) 0 - 44 U/L    Comment: Please note change in reference range.   Alkaline Phosphatase 54 38 - 126 U/L   Total Bilirubin 1.3 (H) 0.3 - 1.2 mg/dL   GFR calc non Af Amer >60 >60 mL/min   GFR calc Af Amer >60 >60 mL/min    Comment: (NOTE) The eGFR has been calculated using the CKD EPI equation. This calculation has not been validated in all clinical situations. eGFR's persistently <60 mL/min signify possible Chronic Kidney Disease.    Anion gap 8 5 - 15    Comment: Performed at [facility], [place] [address] [place], [place] [address]    Current Facility-Administered Medications  Medication Dose Route Frequency Provider Last Rate Last Dose  . ARIPiprazole (ABILIFY) tablet 2 mg  2 mg Oral QPM Lavina Hamman, MD      . fluvoxaMINE (LUVOX) tablet 50 mg  50 mg Oral Daily Jani Gravel, MD   50 mg at 07/17/18 1025  . guanFACINE (INTUNIV) ER tablet 4 mg  4 mg Oral Daily Jani Gravel, MD   4 mg at 07/17/18 1025  . polyethylene glycol (MIRALAX / GLYCOLAX) packet 17 g  17 g Oral Daily Lavina Hamman, MD      . senna-docusate (Senokot-S) tablet 1 tablet  1 tablet Oral BID Lavina Hamman, MD      . vitamin B-12 (CYANOCOBALAMIN) tablet 2,000 mcg  2,000 mcg Oral Daily Jani Gravel, MD   2,000 mcg at 07/17/18 1025    Musculoskeletal: Strength & Muscle Tone: within normal limits Gait & Station: UTA since patient is lying in bed. Patient leans: N/A  Psychiatric Specialty Exam: Physical Exam   Nursing note and vitals reviewed. Constitutional: He is oriented to person, place, and time. He appears well-developed and well-nourished.  HENT:  Head: Normocephalic and atraumatic.  Neck: Normal range of motion.  Respiratory: Effort normal.  Musculoskeletal: Normal range of motion.  Neurological: He is alert and oriented to person, place, and time.  Skin: No rash noted.  Psychiatric: He has a normal mood and affect. His speech is normal. Judgment and thought content normal. He is slowed. Cognition and memory are normal.    Review of Systems  Constitutional: Positive for chills. Negative for fever.  Cardiovascular: Positive for chest pain.  Gastrointestinal: Positive for abdominal pain and constipation. Negative for diarrhea, nausea and vomiting.  Psychiatric/Behavioral: Negative for depression, hallucinations, substance abuse and suicidal ideas. The patient has insomnia. The patient is not nervous/anxious.   All other systems reviewed and are negative.   Blood pressure 121/74, pulse 72, temperature 98.3 F (36.8 C), temperature source Oral, resp. rate 16, height _0  (1.803 m), weight 132 kg (291 lb), SpO2 100 %.Body mass index is 40.59 kg/m.  General Appearance: Fairly Groomed, young, obese, Caucasian male, wearing a hospital gown with a full beard who is lying in bed. NAD.   Eye  Contact:  Good  Speech:  Clear and Coherent and Normal Rate  Volume:  Normal  Mood:  Euthymic  Affect:  Constricted  Thought Process:  Goal Directed, Linear and Descriptions of Associations: Intact  Orientation:  Full (Time, Place, and Person)  Thought Content:  Logical  Suicidal Thoughts:  No  Homicidal Thoughts:  No  Memory:  Immediate;   Good Recent;   Good Remote;   Good  Judgement:  Fair  Insight:  Fair  Psychomotor Activity:  Normal  Concentration:  Concentration: Good and Attention Span: Good  Recall:  Good  Fund of Knowledge:  Good  Language:  Good  Akathisia:  No  Handed:  Right   AIMS (if indicated):   N/A  Assets:  Communication Skills Desire for Improvement Housing Social Support  ADL's:  Intact  Cognition:  WNL  Sleep:   Poor since hospitalization.    Assessment: Lucah Petta is a 22 y.o. male who was admitted with altered mental status with subacute behavioral changes. He reports an acute change in mental status over 2 days prior to hospitalization with fatigue, poor appetite, poor sleep and neurological symptoms. He denies depression or anxiety at this time. He denies SI, HI or AVH. His family and group home worker report that prior to this abrupt change that he was doing well. His acute mental status change is likely secondary to a medical cause and he should continue to be medically worked up.   Treatment Plan Summary: -Continue Abilify 2 mg qhs for anxiety. Agree with decreasing medication in case related to current presentation but unlikely since started a month ago and more recent abrupt change in mental status.  -Continue Intuniv 4 mg daily for ADHD.  -Patient should follow up with outpatient provider for further medication management.  -Recommend EKG to monitor for QTc prolongation.  -Psychiatry will sign off on patient at this time. Please consult psychiatry again as needed.  Disposition: No evidence of imminent risk to self or others at present.   Patient does not meet criteria for psychiatric inpatient admission.  Faythe Dingwall, DO 07/17/2018 11:00 AM

## 2018-07-17 NOTE — Progress Notes (Signed)
07/17/18 @ 1843  Pt had difficulty with exam, able to get halfway through when pt squeezed emergency ball. Upon speaking to pt he kept repeating "I don't want to do this.."  Pt was taken out of scanner and carelink was called. Attempted to alert RN.

## 2018-07-17 NOTE — Progress Notes (Addendum)
Dr. Laurence SlateAroor [facility] hr [facility].  Will put in transfer order for this to happen.  Also CM may wish to review, I think he may meet Inpatient criteria at this point?

## 2018-07-17 NOTE — Procedures (Signed)
ELECTROENCEPHALOGRAM REPORT   Patient: Nicholas Beard       Room #: 1443 EEG No. ID: [id number] Age: 22 y.o.        Sex: male Referring Physician: Allena KatzPatel Report Date:  07/17/2018        Interpreting Physician: Thana FarrEYNOLDS, Nicholas Beard  History: Nicholas Beard is an 22 y.o. male with a history of autism presenting with behavioral changes  Medications:  Abilify, Luvox, Intuniv, Miralax, Senokot, Vitamin B12  Conditions of Recording:  This is a 21 channel routine scalp EEG performed with bipolar and monopolar montages arranged in accordance to the international 10/20 system of electrode placement. One channel was dedicated to EKG recording.  The patient in the awake, drowsy and asleep states.  Description:  The waking background activity consists of a low voltage, symmetrical, fairly well organized, 9 Hz alpha activity, seen from the parieto-occipital and posterior temporal regions.  Low voltage fast activity, poorly organized, is seen anteriorly and is at times superimposed on more posterior regions.  A mixture of theta and alpha rhythms are seen from the central and temporal regions. The patient drowses with slowing to irregular, low voltage theta and beta activity.   The patient goes in to a light sleep with symmetrical sleep spindles, vertex central sharp transients and irregular slow activity.  Intermittently is noted sharp transient over the left hemisphere with phase reversal at T3.  There is associated slow activity as well.   Hyperventilation and intermittent photic stimulation were not performed.  IMPRESSION: This is an abnormal EEG secondary to focal spike and wave activity in the mid-temporal region on the left with phase reversal at T3.  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    Thana FarrLeslie Tomorrow Dehaas, MD Neurology [phone] 07/17/2018, 4:57 PM

## 2018-07-17 NOTE — Progress Notes (Signed)
[facility] Progress Note  Patient: Nicholas Beard CWC:[id number]   PCP: Lin Landsman, MD DOB: 06-Mar-1996   DOA: 07/16/2018   DOS: 07/17/2018   Date of Service: the patient was seen and examined on 07/17/2018  Subjective: Patient complains of abdominal pain, located in the upper abdomen region.  Continuous unrelenting feels like discomfort.  No nausea no vomiting.  Complains about constipation.  No fever no chills.  Grandmother at bedside feels that the patient is not at his baseline.  Brief hospital course: Pt. with PMH of autism; admitted on 07/16/2018, presented with complaint of progressive weakness and constipation, was found to have constipation and medication side effect. Currently further plan is continue close observation.  Assessment and Plan: 1.  Acute encephalopathy Most likely postictal from seizures. Likely toxic secondary to medication. Patient is slow to respond, at his baseline per his grandmother patient is talkative and highly functioning. In June his Rexulti was changed to Abilify as he had 40+ pound weight gain in 1 year on this medication. Patient was fine until 2 days ago and slowly became less talkative and communicative. He also responded that he has some weird smell which no one else in the room was smelling. The day of admission he had an event during which he was not responding at all and was staring and therefore he was brought to the hospital. Currently he is awake no seizure-like events here in the hospital. Neurology consulted, MRI brain with and without contrast ordered although need to be done at [facility] due to his weight. EEG is showing focal spikes in temporal region potentially epileptogenic. MRI brain with and without contrast still pending. Discussed with neurology on-call, they will follow-up on the patient.  Neurology recommended to reduce the dose of Abilify from 5 mg to 2 mg. Psychiatry consulted for further assistance as well feels that  reducing the Abilify at present is okay and can also discontinue it outpatient but need to rule out any other medical cause causing patient's encephalopathy. No asterixis noted. UDS negative, ammonia normal, TSH mildly elevated, will check free T4 tomorrow but does not appear to be significantly elevated to cause patient's presentation, ESR normal, RPR normal, HIV negative.  B12 305 relatively low, patient received IM B12 yesterday and is on B12 supplementation. We will follow-up on the work-up.  2.  Abdominal pain. Constipation. Lipase normal, LFT x2 normal. Hepatitis panel pending. On the day of admission shows no bowel wall thickening no obstruction no abscess no rupture.  No other acute abnormality. X-ray abdomen today shows constipation. Continue MiraLAX and Senokot. May require milk of magnesia/MAG citrate.  3.  Morbid obesity Body mass index is 40.59 kg/m.  Per patient's grandmother, secondary to medication side effect. Will check free T4.  4.  Leukocytosis. Poor p.o. intake. Likely secondary to constipation and stress. Monitor.  5.  Dysphagia. Patient reported that he had difficulty swallowing solid food but was able to swallow liquid food. Denies any acid reflux complaint. Continue soft diet for now.  6.  Elevated LFTs. There is mild elevation of LFTs which is now also getting better. Patient does have some right upper quadrant upper upper abdominal pain which can be explained by the constipation and a CT scan of the abdomen yesterday did not show any acute abnormality although since ultrasound is been regarded as a better modality for imaging I will do an ultrasound abdomen to rule out any acute abnormality.  Diet: regular diet DVT Prophylaxis: subcutaneous Heparin  Advance  goals of care discussion: full code  Family Communication: family was present at bedside, at the time of interview. The pt provided permission to discuss medical plan with the family. Opportunity  was given to ask question and all questions were answered satisfactorily.   Disposition:  Discharge to home.  Consultants: neurology  Psychiatry  Procedures: EEG.  Antibiotics: Anti-infectives (From admission, onward)   None       Objective: Physical Exam: Vitals:   07/16/18 1930 07/16/18 2139 07/16/18 2246 07/17/18 0446  BP: 133/75 (!) 145/67 139/87 121/74  Pulse: 81 84 78 72  Resp: (!) 28 13 16 16   Temp:  97.9 F (36.6 C) 97.9 F (36.6 C) 98.3 F (36.8 C)  TempSrc:  Oral Oral Oral  SpO2: 99% 97% 100% 100%  Weight:   132 kg (291 lb)   Height:   5' 11"  (1.803 m)     Intake/Output Summary (Last 24 hours) at 07/17/2018 1603 Last data filed at 07/17/2018 0951 Gross per 24 hour  Intake 1360 ml  Output -  Net 1360 ml   Filed Weights   07/16/18 2246  Weight: 132 kg (291 lb)   General: Alert, Awake and Oriented to Time, Place and Person. Appear in moderate distress, affect blunted Eyes: PERRL, Conjunctiva normal ENT: Oral Mucosa clear moist. Neck: difficult to assess JVD, no Abnormal Mass Or lumps Cardiovascular: S1 and S2 Present, no Murmur, Peripheral Pulses Present Respiratory: normal respiratory effort, Bilateral Air entry equal and Decreased, no use of accessory muscle, Clear to Auscultation, no Crackles, no wheezes Abdomen: Bowel Sound present, Soft and upper abdominal tenderness, no hernia Skin: no redness, no Rash, no induration Extremities: no Pedal edema, no calf tenderness Neurologic: Grossly no focal neuro deficit. Bilaterally Equal motor strength  Data Reviewed: CBC: Recent Labs  Lab 07/16/18 1730 07/17/18 0424  WBC 13.4* 11.5*  NEUTROABS 9.2*  --   HGB 17.0 16.1  HCT 49.5 48.5  MCV 92.5 94.2  PLT 255 915   Basic Metabolic Panel: Recent Labs  Lab 07/16/18 1730 07/17/18 0424  NA 142 144  K 3.7 4.7  CL 108 111  CO2 22 25  GLUCOSE 86 93  BUN 10 10  CREATININE 0.95 0.84  CALCIUM 9.6 9.3    Liver Function Tests: Recent Labs  Lab  07/16/18 1730 07/17/18 0424  AST 69* 67*  ALT 106* 99*  ALKPHOS 63 54  BILITOT 0.9 1.3*  PROT 8.3* 7.3  ALBUMIN 4.5 4.2   Recent Labs  Lab 07/16/18 1730  LIPASE 27   Recent Labs  Lab 07/16/18 1932  AMMONIA 12   Coagulation Profile: No results for input(s): INR, PROTIME in the last 168 hours. Cardiac Enzymes: No results for input(s): CKTOTAL, CKMB, CKMBINDEX, TROPONINI in the last 168 hours. BNP (last 3 results) No results for input(s): PROBNP in the last 8760 hours. CBG: No results for input(s): GLUCAP in the last 168 hours. Studies: Dg Chest 2 View  Result Date: 07/16/2018 CLINICAL DATA:  Dyspnea EXAM: CHEST - 2 VIEW COMPARISON:  None. FINDINGS: No acute airspace disease or effusion. Normal heart size. Mild right paramediastinal opacity, suspected to represent vasculature. Abrupt scoliosis of the mid to upper thoracic spine with possible vertebral anomaly. IMPRESSION: No active cardiopulmonary disease. Electronically Signed   By: Donavan Foil M.D.   On: 07/16/2018 22:30   Ct Head Wo Contrast  Result Date: 07/16/2018 CLINICAL DATA:  Altered level of consciousness.  Autism EXAM: CT HEAD WITHOUT CONTRAST TECHNIQUE: Contiguous axial images  were obtained from the base of the skull through the vertex without intravenous contrast. COMPARISON:  None. FINDINGS: Brain: The ventricles are normal in size and configuration. There is no intracranial mass, hemorrhage, extra-axial fluid collection, or midline shift. Gray-white compartments appear normal. No acute infarct is evident. Vascular: No hyperdense vessel. No vascular calcifications are evident. Skull: The bony calvarium appears intact. Sinuses/Orbits: There is a small retention cyst in the anterior right maxillary antrum. Other paranasal sinuses which are visualized are clear. Orbits appear symmetric bilaterally. Other: Mastoid air cells are clear. IMPRESSION: Small retention cyst in the anterior right maxillary antrum. Study otherwise  unremarkable. Electronically Signed   By: Lowella Grip III M.D.   On: 07/16/2018 19:01   Ct Abdomen Pelvis W Contrast  Result Date: 07/16/2018 CLINICAL DATA:  Abdominal discomfort EXAM: CT ABDOMEN AND PELVIS WITH CONTRAST TECHNIQUE: Multidetector CT imaging of the abdomen and pelvis was performed using the standard protocol following bolus administration of intravenous contrast. CONTRAST:  <See Chart> ISOVUE-300 IOPAMIDOL (ISOVUE-300) INJECTION 61% COMPARISON:  None. FINDINGS: Lower chest: Lung bases are clear. Hepatobiliary: There is hepatic steatosis. No focal liver lesions are evident. Gallbladder wall is not appreciably thickened. There is no biliary duct dilatation. Pancreas: There is no appreciable pancreatic mass or inflammatory focus. Spleen: No splenic lesions are evident. Adrenals/Urinary Tract: Adrenals bilaterally appear normal. Kidneys bilaterally show no evident mass or hydronephrosis on either side. There is no appreciable renal or ureteral calculus on either side. Urinary bladder is midline with wall thickness within normal limits. Stomach/Bowel: There is no appreciable bowel wall or mesenteric thickening. No evident bowel obstruction. No free air or portal venous air. There is moderate stool in the colon. Colon is not distended with stool. Vascular/Lymphatic: No abdominal aortic aneurysm. No vascular lesions are evident. There is no appreciable adenopathy in the abdomen or pelvis. Reproductive: Prostate and seminal vesicles are normal in size and contour. No evident pelvic mass. Other: Appendix appears normal. There is no abscess or ascites appreciable in the abdomen or pelvis. There is a minimal ventral hernia containing only fat. Musculoskeletal: No blastic or lytic bone lesions. No intramuscular or abdominal wall lesions are evident. IMPRESSION: 1. No evident bowel wall thickening or bowel obstruction. No abscess in the abdomen pelvis. Appendix appears normal. 2.  Hepatic steatosis  without focal liver lesion evident. 3.  No renal or ureteral calculus.  No hydronephrosis. 4.  Rather minimal ventral hernia containing only fat. Electronically Signed   By: Lowella Grip III M.D.   On: 07/16/2018 19:05   Dg Abd Portable 1v  Result Date: 07/17/2018 CLINICAL DATA:  Three days of abdominal pain EXAM: PORTABLE ABDOMEN - 1 VIEW COMPARISON:  Abdominal and pelvic CT scan of July 16, 2018 FINDINGS: The colonic stool burden is moderate. No small or large bowel obstructive pattern is observed. There is a small amount of gas within the stomach. There are no abnormal soft tissue calcifications. IMPRESSION: Moderately increased colonic stool burden may reflect constipation in the appropriate clinical setting. No evidence of obstruction or fecal impaction. Electronically Signed   By: David  Martinique M.D.   On: 07/17/2018 12:38    Scheduled Meds: . ARIPiprazole  2 mg Oral QPM  . fluvoxaMINE  50 mg Oral Daily  . guanFACINE  4 mg Oral Daily  . polyethylene glycol  17 g Oral Daily  . senna-docusate  1 tablet Oral BID  . vitamin B-12  2,000 mcg Oral Daily   Continuous Infusions: PRN Meds:  Time spent: 35 minutes  Author: Berle Mull, MD [facility] Pager: [phone] 07/17/2018 4:03 PM  If 7PM-7AM, please contact night-coverage at [email or web address], password [facility]

## 2018-07-17 NOTE — Progress Notes (Signed)
Offsite EEG completed, results pending. 

## 2018-07-17 NOTE — Consult Note (Signed)
NEURO HOSPITALIST CONSULT NOTE   Requestig physician: Dr. Selena Batten  Reason for Consult: Acute behavioral change  History obtained from:  Mother and Chart    HPI:                                                                                                                                          Nicholas Beard is an 22 y.o. male with autism, living in a group home with one other resident and a care partner, who presents with acute behavioral change. First noted symptom was 2 days of "not feeling well" with decreased appetite. Then, at 3 AM yesterday, he woke up, went to the aide and told her that he was smelling something strange. He states now that it was a "sour" odor. No one else could smell it. Later, the patient became more withdrawn with significantly decreased verbal output. He also had been complaining of constipation. About one month ago, one of his psych meds, Rexulti was changed to Abilify; his appetite has been lower than normal since then.   He had one prior episode similar to this one when he was 17, with blank staring and decreased verbal output, which resolved when Concerta was discontinued (it had been prescribed for ADHD).   The patient denies being given any recreational substances to consume or smoke.   EMS was called for what was initially thought to be a possible breathing problem, but no respiratory distress was apparent to them. EMS felt that his "eyes were jumping". They did note that he was refusing to answer all questions, which was unusual as mother states he is generally quite loquacious. At baseline he is also "very high functioning". Other symptoms per ED Triage note: "Pt then stated c/o constipation, states he has been going at weird times and feels backed up, asked EMS for something to help.[facility] reports Pt did not answer questions about BM appropriately and stated they feel that Pt may have possibly inserted something in rectum due to constipation  and avoidance of questions, Pt also stated to EMS " how do you pop a stomach" further leading to prior possibility."  LFTs in ED were slightly elevated and CT abd/pelvis showed fatty liver, but no abdominal process. CT head was unremarkable.    Past Medical History:  Diagnosis Date  . Autism spectrum disorder     History reviewed. No pertinent surgical history.  Family History  Problem Relation Age of Onset  . Brain cancer Mother               Social History:  reports that he has never smoked. He has never used smokeless tobacco. He reports that he does not drink alcohol. His drug history is not on file.  Allergies  Allergen Reactions  .  Other Hives    Bug  bites    MEDICATIONS:                                                                                                                      Current Meds  Medication Sig  . ARIPiprazole (ABILIFY) 5 MG tablet Take 5 mg by mouth every evening.  . fluvoxaMINE (LUVOX) 50 MG tablet Take 50 mg by mouth daily.  [facility] guanFACINE (INTUNIV) 4 MG TB24 ER tablet Take 4 mg by mouth daily.     ROS:                                                                                                                                       As per HPI. Does not endorse any other symptoms.    Blood pressure 139/87, pulse 78, temperature 97.9 F (36.6 C), temperature source Oral, resp. rate 16, height 5\' 11"  (1.803 m), weight 132 kg (291 lb), SpO2 100 %.   General Examination:                                                                                                       Physical Exam  HEENT-  [place]/AT  Lungs- Respirations unlabored Extremities- Warm and well perfused   Neurological Examination Mental Status: Awake and alert with decreased volitional verbal and motor output. No spontaneous verbal output or spontaneous movement except for moving head to look at examiner and mother. Will move when asked. Limited speech output in short sentences  without impaired fluency. Able to follow a 3-step command and name objects. Fully oriented. No agitation noted. Does not appear to be hallucinating, but appears withdrawn and guarded when interacting with examiner. Significantly flattened affect.  Cranial Nerves: II: Visual fields intact. No extinction to DSS. PERRL.   III,IV, VI: EOMI without nystagmus. Has difficulty maintaining attention during assessment of visual pursuits, bringing eyes back to midline with an otherwise normal saccade despite being asked several times to  keep his eyes on the moving object.  V,VII: Face symmetric. Facial temp sensation intact bilaterally VIII: hearing intact to voice IX,X: No hypophonia XI: Symmetric XII: midline tongue extension Motor: Poor effort x 4. Maximum elicitable strength is 4/5 bilateral upper and lower extremities, proximally and distally. Normal tone and bulk x 4.  Sensory: Temp and light touch intact x 4. No extinction.  Deep Tendon Reflexes: 3+ throughout, except for 2+ achilles bilaterally. Symmetric throughout.  Plantars: Right: downgoing   Left: downgoing Cerebellar: No ataxia with FNF bilaterally.  Gait: Deferred   Lab Results: Basic Metabolic Panel: Recent Labs  Lab 07/16/18 1730  NA 142  K 3.7  CL 108  CO2 22  GLUCOSE 86  BUN 10  CREATININE 0.95  CALCIUM 9.6    CBC: Recent Labs  Lab 07/16/18 1730  WBC 13.4*  NEUTROABS 9.2*  HGB 17.0  HCT 49.5  MCV 92.5  PLT 255    Cardiac Enzymes: No results for input(s): CKTOTAL, CKMB, CKMBINDEX, TROPONINI in the last 168 hours.  Lipid Panel: No results for input(s): CHOL, TRIG, HDL, CHOLHDL, VLDL, LDLCALC in the last 168 hours.  Imaging: Dg Chest 2 View  Result Date: 07/16/2018 CLINICAL DATA:  Dyspnea EXAM: CHEST - 2 VIEW COMPARISON:  None. FINDINGS: No acute airspace disease or effusion. Normal heart size. Mild right paramediastinal opacity, suspected to represent vasculature. Abrupt scoliosis of the mid to upper  thoracic spine with possible vertebral anomaly. IMPRESSION: No active cardiopulmonary disease. Electronically Signed   By: Jasmine PangKim  Fujinaga M.D.   On: 07/16/2018 22:30   Ct Head Wo Contrast  Result Date: 07/16/2018 CLINICAL DATA:  Altered level of consciousness.  Autism EXAM: CT HEAD WITHOUT CONTRAST TECHNIQUE: Contiguous axial images were obtained from the base of the skull through the vertex without intravenous contrast. COMPARISON:  None. FINDINGS: Brain: The ventricles are normal in size and configuration. There is no intracranial mass, hemorrhage, extra-axial fluid collection, or midline shift. Gray-white compartments appear normal. No acute infarct is evident. Vascular: No hyperdense vessel. No vascular calcifications are evident. Skull: The bony calvarium appears intact. Sinuses/Orbits: There is a small retention cyst in the anterior right maxillary antrum. Other paranasal sinuses which are visualized are clear. Orbits appear symmetric bilaterally. Other: Mastoid air cells are clear. IMPRESSION: Small retention cyst in the anterior right maxillary antrum. Study otherwise unremarkable. Electronically Signed   By: Bretta BangWilliam  Woodruff III M.D.   On: 07/16/2018 19:01   Ct Abdomen Pelvis W Contrast  Result Date: 07/16/2018 CLINICAL DATA:  Abdominal discomfort EXAM: CT ABDOMEN AND PELVIS WITH CONTRAST TECHNIQUE: Multidetector CT imaging of the abdomen and pelvis was performed using the standard protocol following bolus administration of intravenous contrast. CONTRAST:  <See Chart> ISOVUE-300 IOPAMIDOL (ISOVUE-300) INJECTION 61% COMPARISON:  None. FINDINGS: Lower chest: Lung bases are clear. Hepatobiliary: There is hepatic steatosis. No focal liver lesions are evident. Gallbladder wall is not appreciably thickened. There is no biliary duct dilatation. Pancreas: There is no appreciable pancreatic mass or inflammatory focus. Spleen: No splenic lesions are evident. Adrenals/Urinary Tract: Adrenals bilaterally appear  normal. Kidneys bilaterally show no evident mass or hydronephrosis on either side. There is no appreciable renal or ureteral calculus on either side. Urinary bladder is midline with wall thickness within normal limits. Stomach/Bowel: There is no appreciable bowel wall or mesenteric thickening. No evident bowel obstruction. No free air or portal venous air. There is moderate stool in the colon. Colon is not distended with stool. Vascular/Lymphatic: No abdominal aortic aneurysm. No  vascular lesions are evident. There is no appreciable adenopathy in the abdomen or pelvis. Reproductive: Prostate and seminal vesicles are normal in size and contour. No evident pelvic mass. Other: Appendix appears normal. There is no abscess or ascites appreciable in the abdomen or pelvis. There is a minimal ventral hernia containing only fat. Musculoskeletal: No blastic or lytic bone lesions. No intramuscular or abdominal wall lesions are evident. IMPRESSION: 1. No evident bowel wall thickening or bowel obstruction. No abscess in the abdomen pelvis. Appendix appears normal. 2.  Hepatic steatosis without focal liver lesion evident. 3.  No renal or ureteral calculus.  No hydronephrosis. 4.  Rather minimal ventral hernia containing only fat. Electronically Signed   By: Bretta Bang III M.D.   On: 07/16/2018 19:05    Assessment/Recommendations: 22 year old autistic male with subacute behavioral changes 1. DDx includes atypical seizure (involving possible olfactory hallucination based on history) and psychiatric change (psychotic break, new onset of depression). 2. Also on DDx is his recent medication change to aripiprazole. This medication, which is a D2 antagonist, serotonin agonist has several side effects including lethargy, sedation and appetite suppression. Consider lowering his daily dose to 2 mg and observe over several weeks for possible improvement.  3. No nuchal rigidity, fever or white count to suggest infection.  4.  MRI brain 5. EEG.    Electronically signed: Dr. Caryl Pina 07/17/2018, 1:54 AM

## 2018-07-18 ENCOUNTER — Observation Stay (HOSPITAL_COMMUNITY): Payer: Medicare Other

## 2018-07-18 DIAGNOSIS — R400 Somnolence: Secondary | ICD-10-CM | POA: Diagnosis not present

## 2018-07-18 LAB — CBC WITH DIFFERENTIAL/PLATELET
ABS IMMATURE GRANULOCYTES: 0.1 10*3/uL (ref 0.0–0.1)
Basophils Absolute: 0.1 10*3/uL (ref 0.0–0.1)
Basophils Relative: 1 %
Eosinophils Absolute: 0 10*3/uL (ref 0.0–0.7)
Eosinophils Relative: 0 %
HEMATOCRIT: 51.6 % (ref 39.0–52.0)
HEMOGLOBIN: 17.3 g/dL — AB (ref 13.0–17.0)
IMMATURE GRANULOCYTES: 0 %
LYMPHS ABS: 1.9 10*3/uL (ref 0.7–4.0)
Lymphocytes Relative: 16 %
MCH: 30.6 pg (ref 26.0–34.0)
MCHC: 33.5 g/dL (ref 30.0–36.0)
MCV: 91.2 fL (ref 78.0–100.0)
MONOS PCT: 5 %
Monocytes Absolute: 0.6 10*3/uL (ref 0.1–1.0)
NEUTROS ABS: 9 10*3/uL — AB (ref 1.7–7.7)
NEUTROS PCT: 78 %
Platelets: 233 10*3/uL (ref 150–400)
RBC: 5.66 MIL/uL (ref 4.22–5.81)
RDW: 11.9 % (ref 11.5–15.5)
WBC: 11.6 10*3/uL — AB (ref 4.0–10.5)

## 2018-07-18 LAB — BASIC METABOLIC PANEL
ANION GAP: 8 (ref 5–15)
BUN: 12 mg/dL (ref 6–20)
CO2: 25 mmol/L (ref 22–32)
Calcium: 9.6 mg/dL (ref 8.9–10.3)
Chloride: 107 mmol/L (ref 98–111)
Creatinine, Ser: 1.17 mg/dL (ref 0.61–1.24)
GFR calc Af Amer: >60 mL/min (ref >60)
GFR calc non Af Amer: >60 mL/min (ref >60)
GLUCOSE: 95 mg/dL (ref 70–99)
POTASSIUM: 4.1 mmol/L (ref 3.5–5.1)
Sodium: 140 mmol/L (ref 135–145)

## 2018-07-18 LAB — HEPATITIS PANEL, ACUTE
HCV Ab: <0.1 s/co ratio (ref 0.0–0.9)
HEP B C IGM: NEGATIVE
Hep A IgM: NEGATIVE
Hepatitis B Surface Ag: NEGATIVE

## 2018-07-18 LAB — FOLATE RBC
FOLATE, RBC: UNDETERMINED ng/mL
Folate, Hemolysate: 482.4 ng/mL

## 2018-07-18 LAB — MRSA PCR SCREENING: MRSA by PCR: NEGATIVE

## 2018-07-18 LAB — ANA: Anti Nuclear Antibody(ANA): NEGATIVE

## 2018-07-18 LAB — HEMATOLOGY COMMENTS:

## 2018-07-18 NOTE — Progress Notes (Signed)
PROGRESS NOTE    Nicholas Beard  RUE:[id number] DOB: 13-Jul-1996 DOA: 07/16/2018 PCP: Leilani Able, MD  Outpatient Specialists:     Brief Narrative:  22 yo male with PMH significant for asthma, ADHD and autism spectrum disorder presented to [facility] with 2 days of decreased mental status, trouble with speech, and olfactory hallucinations, per family, and right upper quadrant abdominal pain. CT abd demonstrated evidence of fatty liver disease and abdominal XR demonstrated constipation. Neurology consulted for AMS. The patient was transferred to [facility] early this morning for 24hr EEG monitoring to further assess for medical etiology behind decline in mental status and for additional abdominal workup to rule out acute abnormality.     Assessment & Plan:   Principal Problem:   Altered mental status  Altered Mental Status: -CT head WO contrast - Small retention cyst in the anterior right maxillary antrum. Study otherwise unremarkable. -MR Brain WO contrast - No acute intracranial process on this limited MRI of the head. Borderline microcephaly. -EEG - This is an abnormal EEG secondary to focal spike and wave activity in the mid-temporal region on the left with phase reversal at T3.  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  -Neurology following. 24-hr EEG in process. -Abilify lowered to 2 mg daily. Monitoring for mental status improvement  Abdominal Pain: -CXR No active cardiopulmonary disease -CT abd/pelvis No evident bowel wall thickening or bowel obstruction. No abscess in the abdomen pelvis. Appendix appears normal. Hepatic steatosis without focal liver lesion evident. No renal or ureteral calculus.  No hydronephrosis. Rather minimal ventral hernia containing only fat. -Abd XR Moderately increased colonic stool burden may reflect constipation in the appropriate clinical setting. No evidence of obstruction or fecal impaction. -[place] abd shows increased hepatic  echogenicity consistent fatty infiltration or hepatocellular disease; exam otherwise unremarkable.  -Continue miralax/glycolax 17g daily and Senokot 1 tab BID for constipation  Elevated LFTs, NAFLD: -AST 67, ALT 99 -Hepatitis panel - negative. (HCV ab <0.1, Hep A IgM neg, Hep B C IgM neg) -Ammonia normal -Patient endorses RUQ pain -Grandmother endorses recent 50 lb weight gain presumably from [place] and was changed to Ability one month ago -[place] abd showed increased hepatic echogenicity consistent fatty infiltration or hepatocellular disease. Exam otherwise unremarkable.  -Lipid panel ordered -HgbA1c ordered -Patient counseled on importance of weight loss  Subclinical hypothyroidism -TSH mildly elevated - 5.457 -PCP follow up to recheck levels in 3 months  ADHD: -Intuniv 4 mg daily  Asthma: -No acute complications    DVT prophylaxis: subq Heparin Code Status: Full Family Communication: The grandmother was present at bedside and plans were discussed and agreed upon.  Disposition Plan: The patient will be monitored using 24 hr EEG to asses for epileptogenic etiology for altered mental status. Pending these results, the patient can be discharged home to the care of grandmother and pcp follow up for management of NAFLD.  Consultants:   Neurology  Psychiatry  Procedures:   24 EEG  Antimicrobials:   None   Subjective: Patient was lying in hospital bed alert and oriented to person, place, and time in no acute distress. Denies abdominal pain or pain anywhere. "I can't feel my body." Endorses extreme weakness. However, grandmother states he was able to get up and use the bathroom. She also noted that his mentation has come and gone over the last 24 hours. Yesterday morning he did not remember how to take his medications or how to use the bathroom. However, he showed signs of  improvement late last night. Then, this morning, he had a noticeable decline once again, with the apparent  full body numbness, weakness, and trouble forming sentences.  The patient denies smelling any odors. Denies auditory/visual hallucinations. At times, patient appeared to have difficulty forming sentences, however could answer all questions appropriately. Endorses ongoing issue with constipation.  Objective: Vitals:   07/18/18 0147 07/18/18 0230 07/18/18 0303 07/18/18 0800  BP: 123/78 128/62 130/82   Pulse: 79 70 69 77  Resp: 18 17 16  (!) 8  Temp: 98.7 F (37.1 C) 97.8 F (36.6 C) 97.7 F (36.5 C)   TempSrc: Oral Oral Oral   SpO2: 100% 100% 100% 98%  Weight:      Height:        Intake/Output Summary (Last 24 hours) at 07/18/2018 0830 Last data filed at 07/17/2018 0951 Gross per 24 hour  Intake 120 ml  Output -  Net 120 ml   Filed Weights   07/16/18 2246  Weight: 132 kg (291 lb)    Examination:  General exam: Appears calm and comfortable  Respiratory system: Clear to auscultation. Respiratory effort normal. Cardiovascular system: S1 & S2 heard, RRR. No JVD, murmurs, rubs, gallops or clicks. No pedal edema. Gastrointestinal system: Abdomen is nondistended, soft and nontender. No organomegaly or masses felt. Normal bowel sounds heard. Central nervous system: Alert and oriented. No focal neurological deficits. Extremities: Decreased strength in upper and lower extremities Skin: No rashes, lesions or ulcers Psychiatry: Judgement and insight appear normal. Mood & affect appropriate. Trouble putting sentences together. A&O to person, place, and time.    Data Reviewed: I have personally reviewed following labs and imaging studies  CBC: Recent Labs  Lab 07/16/18 1730 07/17/18 0424  WBC 13.4* 11.5*  NEUTROABS 9.2*  --   HGB 17.0 16.1  HCT 49.5 48.5  MCV 92.5 94.2  PLT 255 219   Basic Metabolic Panel: Recent Labs  Lab 07/16/18 1730 07/17/18 0424  NA 142 144  K 3.7 4.7  CL 108 111  CO2 22 25  GLUCOSE 86 93  BUN 10 10  CREATININE 0.95 0.84  CALCIUM 9.6 9.3    GFR: Estimated Creatinine Clearance: 194.4 mL/min (by C-G formula based on SCr of 0.84 mg/dL). Liver Function Tests: Recent Labs  Lab 07/16/18 1730 07/17/18 0424  AST 69* 67*  ALT 106* 99*  ALKPHOS 63 54  BILITOT 0.9 1.3*  PROT 8.3* 7.3  ALBUMIN 4.5 4.2   Recent Labs  Lab 07/16/18 1730  LIPASE 27   Recent Labs  Lab 07/16/18 1932  AMMONIA 12   Coagulation Profile: No results for input(s): INR, PROTIME in the last 168 hours. Cardiac Enzymes: No results for input(s): CKTOTAL, CKMB, CKMBINDEX, TROPONINI in the last 168 hours. BNP (last 3 results) No results for input(s): PROBNP in the last 8760 hours. HbA1C: No results for input(s): HGBA1C in the last 72 hours. CBG: No results for input(s): GLUCAP in the last 168 hours. Lipid Profile: No results for input(s): CHOL, HDL, LDLCALC, TRIG, CHOLHDL, LDLDIRECT in the last 72 hours. Thyroid Function Tests: Recent Labs    07/16/18 2047  TSH 5.457*   Anemia Panel: Recent Labs    07/16/18 2047  VITAMINB12 305   Urine analysis:    Component Value Date/Time   COLORURINE YELLOW 07/16/2018 1730   APPEARANCEUR CLEAR 07/16/2018 1730   LABSPEC 1.038 (H) 07/16/2018 1730   PHURINE 7.0 07/16/2018 1730   GLUCOSEU NEGATIVE 07/16/2018 1730   HGBUR NEGATIVE 07/16/2018 1730  BILIRUBINUR NEGATIVE 07/16/2018 1730   KETONESUR 5 (A) 07/16/2018 1730   PROTEINUR NEGATIVE 07/16/2018 1730   NITRITE NEGATIVE 07/16/2018 1730   LEUKOCYTESUR NEGATIVE 07/16/2018 1730   Sepsis Labs: @LABRCNTIP (procalcitonin:4,lacticidven:4)  ) Recent Results (from the past 240 hour(s))  MRSA PCR Screening     Status: None   Collection Time: 07/18/18  2:15 AM  Result Value Ref Range Status   MRSA by PCR NEGATIVE NEGATIVE Final    Comment:        The GeneXpert MRSA Assay (FDA approved for NASAL specimens only), is one component of a comprehensive MRSA colonization surveillance program. It is not intended to diagnose MRSA infection nor to guide  or monitor treatment for MRSA infections. Performed at [facility], [address], [place], [place] [address]          Radiology Studies: Dg Chest 2 View  Result Date: 07/16/2018 CLINICAL DATA:  Dyspnea EXAM: CHEST - 2 VIEW COMPARISON:  None. FINDINGS: No acute airspace disease or effusion. Normal heart size. Mild right paramediastinal opacity, suspected to represent vasculature. Abrupt scoliosis of the mid to upper thoracic spine with possible vertebral anomaly. IMPRESSION: No active cardiopulmonary disease. Electronically Signed   By: Jasmine Pang M.D.   On: 07/16/2018 22:30   Ct Head Wo Contrast  Result Date: 07/16/2018 CLINICAL DATA:  Altered level of consciousness.  Autism EXAM: CT HEAD WITHOUT CONTRAST TECHNIQUE: Contiguous axial images were obtained from the base of the skull through the vertex without intravenous contrast. COMPARISON:  None. FINDINGS: Brain: The ventricles are normal in size and configuration. There is no intracranial mass, hemorrhage, extra-axial fluid collection, or midline shift. Gray-white compartments appear normal. No acute infarct is evident. Vascular: No hyperdense vessel. No vascular calcifications are evident. Skull: The bony calvarium appears intact. Sinuses/Orbits: There is a small retention cyst in the anterior right maxillary antrum. Other paranasal sinuses which are visualized are clear. Orbits appear symmetric bilaterally. Other: Mastoid air cells are clear. IMPRESSION: Small retention cyst in the anterior right maxillary antrum. Study otherwise unremarkable. Electronically Signed   By: Bretta Bang III M.D.   On: 07/16/2018 19:01   Mr Brain Wo Contrast  Result Date: 07/17/2018 CLINICAL DATA:  Altered level of consciousness. EEG demonstrating potential LEFT temporal L LEFT epileptogenic focus. EXAM: MRI HEAD WITHOUT CONTRAST TECHNIQUE: Multiplanar, multiecho pulse sequences of the brain and surrounding structures were obtained without  intravenous contrast. Axial T1, coronal T2 sequences not obtained. Patient did not wish to undergo further imaging. COMPARISON:  CT HEAD July 16, 2018. FINDINGS: Moderately motion degraded sagittal T1 sequence. INTRACRANIAL CONTENTS: No reduced diffusion to suggest acute ischemia or hyperacute demyelination. No susceptibility artifact to suggest hemorrhage. Borderline findings of microcephaly. No hydrocephalus. No suspicious parenchymal signal, masses, mass effect. No abnormal extra-axial fluid collections. No extra-axial masses. VASCULAR: Normal major intracranial vascular flow voids present at skull base. SKULL AND UPPER CERVICAL SPINE: No abnormal sellar expansion. No suspicious calvarial bone marrow signal. Craniocervical junction maintained. SINUSES/ORBITS: Maxillary mucosal retention cysts without paranasal sinus air-fluid levels. Mastoid air cells are well aerated.The included ocular globes and orbital contents are non-suspicious. OTHER: Small cervical lymph nodes. IMPRESSION: No acute intracranial process on this limited MRI of the head. Borderline microcephaly. Electronically Signed   By: Awilda Metro M.D.   On: 07/17/2018 19:04   Ct Abdomen Pelvis W Contrast  Result Date: 07/16/2018 CLINICAL DATA:  Abdominal discomfort EXAM: CT ABDOMEN AND PELVIS WITH CONTRAST TECHNIQUE: Multidetector CT imaging of the abdomen  and pelvis was performed using the standard protocol following bolus administration of intravenous contrast. CONTRAST:  <See Chart> ISOVUE-300 IOPAMIDOL (ISOVUE-300) INJECTION 61% COMPARISON:  None. FINDINGS: Lower chest: Lung bases are clear. Hepatobiliary: There is hepatic steatosis. No focal liver lesions are evident. Gallbladder wall is not appreciably thickened. There is no biliary duct dilatation. Pancreas: There is no appreciable pancreatic mass or inflammatory focus. Spleen: No splenic lesions are evident. Adrenals/Urinary Tract: Adrenals bilaterally appear normal. Kidneys  bilaterally show no evident mass or hydronephrosis on either side. There is no appreciable renal or ureteral calculus on either side. Urinary bladder is midline with wall thickness within normal limits. Stomach/Bowel: There is no appreciable bowel wall or mesenteric thickening. No evident bowel obstruction. No free air or portal venous air. There is moderate stool in the colon. Colon is not distended with stool. Vascular/Lymphatic: No abdominal aortic aneurysm. No vascular lesions are evident. There is no appreciable adenopathy in the abdomen or pelvis. Reproductive: Prostate and seminal vesicles are normal in size and contour. No evident pelvic mass. Other: Appendix appears normal. There is no abscess or ascites appreciable in the abdomen or pelvis. There is a minimal ventral hernia containing only fat. Musculoskeletal: No blastic or lytic bone lesions. No intramuscular or abdominal wall lesions are evident. IMPRESSION: 1. No evident bowel wall thickening or bowel obstruction. No abscess in the abdomen pelvis. Appendix appears normal. 2.  Hepatic steatosis without focal liver lesion evident. 3.  No renal or ureteral calculus.  No hydronephrosis. 4.  Rather minimal ventral hernia containing only fat. Electronically Signed   By: Bretta Bang III M.D.   On: 07/16/2018 19:05   Dg Abd Portable 1v  Result Date: 07/17/2018 CLINICAL DATA:  Three days of abdominal pain EXAM: PORTABLE ABDOMEN - 1 VIEW COMPARISON:  Abdominal and pelvic CT scan of July 16, 2018 FINDINGS: The colonic stool burden is moderate. No small or large bowel obstructive pattern is observed. There is a small amount of gas within the stomach. There are no abnormal soft tissue calcifications. IMPRESSION: Moderately increased colonic stool burden may reflect constipation in the appropriate clinical setting. No evidence of obstruction or fecal impaction. Electronically Signed   By: David  [place] M.D.   On: 07/17/2018 12:38        Scheduled  Meds: . ARIPiprazole  2 mg Oral QPM  . fluvoxaMINE  50 mg Oral Daily  . guanFACINE  4 mg Oral Daily  . polyethylene glycol  17 g Oral Daily  . senna-docusate  1 tablet Oral BID  . vitamin B-12  2,000 mcg Oral Daily   Continuous Infusions:   LOS: 0 days    Nedra Hai, PA-S Joen Laura MD [facility] Pager [phone]  If 7PM-7AM, please contact night-coverage [email or web address] Password [email or web address] 07/18/2018, 8:30 AM

## 2018-07-18 NOTE — Progress Notes (Signed)
LTM running - no initial skin breakdown. 

## 2018-07-18 NOTE — Progress Notes (Signed)
Reason for consult:   Subjective:Lying bed, not very interactive.    ROS: negative except above   Examination  Vital signs in last 24 hours: Temp:  [97.7 F (36.5 C)-99.1 F (37.3 C)] 97.7 F (36.5 C) (07/23 0303) Pulse Rate:  [69-89] 77 (07/23 0800) Resp:  [16-20] 16 (07/23 0800) BP: (123-134)/(62-84) 127/83 (07/23 0800) SpO2:  [98 %-100 %] 98 % (07/23 0800)  General: Not in distress, cooperative CVS: pulse-normal rate and rhythm RS: breathing comfortably Extremities: normal   Neuro: MS: Alert, oriented to place, person, follows commands CN: pupils equal and reactive,  EOMI, face symmetric, tongue midline, normal sensation over face, Motor: 5/5 strength in all 4 extremities Coordination: normal Gait: not tested  Basic Metabolic Panel: Recent Labs  Lab 07/16/18 1730 07/17/18 0424 07/18/18 1050  NA 142 144 140  K 3.7 4.7 4.1  CL 108 111 107  CO2 22 25 25   GLUCOSE 86 93 95  BUN 10 10 12   CREATININE 0.95 0.84 1.17  CALCIUM 9.6 9.3 9.6    CBC: Recent Labs  Lab 07/16/18 1730 07/16/18 2319 07/17/18 0424 07/18/18 1050  WBC 13.4*  --  11.5* 11.6*  NEUTROABS 9.2*  --   --  9.0*  HGB 17.0  --  16.1 17.3*  HCT 49.5 NOLAV 48.5 51.6  MCV 92.5  --  94.2 91.2  PLT 255  --  219 233     Coagulation Studies: No results for input(s): LABPROT, INR in the last 72 hours.  Imaging Reviewed:     ASSESSMENT AND PLAN  22 y.o. male with autism, living in a group home to the emergency room after waking up with a foul smell and  acute behavioral change.  An EEG obtained showed right temporal spike/sharp wave and there was epileptic genic.  [facility] 24-[facility] EEG to see if nonconvulsive seizures explaining patient's acute behavioral changes.  Plan LTM EEG connected, will watch for 24 hours MRI brain obtained: microcephaly but no other abnormalities Seizure precautions   Nicholas SpinnerSushanth Roslynn Beard [facility] Pager Number  [phone] For questions after 7pm please refer to AMION to reach the Neurologist on call

## 2018-07-18 NOTE — Progress Notes (Signed)
[facility]. [facility].  VS stable.

## 2018-07-18 NOTE — Progress Notes (Signed)
PROGRESS NOTE    Nicholas Beard  HCW:[id number] DOB: [birth year] DOA: 07/16/2018 PCP: Lin Landsman, MD      Brief Narrative:  Nicholas Beard is a 22 y.o. M with autism and anxiety who presented with several days decreased mental status, staring episodes.  Per H&P: "First noted symptom was 2 days of "not feeling well" with decreased appetite. Then, at 3 AM yesterday, he woke up, went to the aide and told her that he was smelling something strange. He states now that it was a "sour" odor. No one else could smell it. Later, the patient became more withdrawn with significantly decreased verbal output. He also had been complaining of constipation. About one month ago, one of his psych meds, Rexulti was changed to Abilify; his appetite has been lower than normal since then.   He had one prior episode similar to this one when he was 89, with blank staring and decreased verbal output, which resolved when Concerta was discontinued (it had been prescribed for ADHD). "   Assessment & Plan:  Altered mental status See summary by Dr. Posey Pronto from 7/22.  MRI and CT brain normal.  Electrolytes and renal function normal.  No indication this is a septic/infectious encephalopathy.  No medication changes recently to support that this is a medication associated toxic encephalopathy.  TSH minimal elevation.  ESR, RPR normal.  Ammonia normal.  Has been evaluated by Psychiatry who do not feel he is psychotic or in a depressive episode.  Differential at this point includes post-ictal confusion, subclinical seizures, conversion disorder/functional neurological syndrome.  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  [place] normal, shows fatty liver.  Not drinker.  Hep serologies negative, HIV negative. Obese and on atypical antipsychotic. -Follow up with PCP  Anxiety Autism -Continue Abilify (this is lowered  dose, due to ?sedation) -Continue Luvox, guanfacine         DVT prophylaxis: Heparin Code Status: FULL Family Communication: Grandmother at [place] and disposition Plan: The below labs and imaging reports were reviewed and summarized above.    The patient was admitted with altered mental status.  Currently working up with 24 hours EEG.  Neurology consulted.       Consultants:   Neurology  Psychiatry  Procedures:   EEG  24 hr EEG  MRI brain  CT head  RUQ [place]  CT abdomen and pelvis  Antimicrobials:   None    Subjective: "Can't feel my body", but at the same time, confirms sensation to light touch.  No confusion.  No seizures or LOC.  No fever.  Still constipated.  Appetite okay.  No cough, sputum.  No focal weakness, slurred speech.     Objective: Vitals:   07/18/18 0147 07/18/18 0230 07/18/18 0303 07/18/18 0800  BP: 123/78 128/62 130/82 127/83  Pulse: 79 70 69 77  Resp: 18 17 16 16   Temp: 98.7 F (37.1 C) 97.8 F (36.6 C) 97.7 F (36.5 C)   TempSrc: Oral Oral Oral   SpO2: 100% 100% 100% 98%  Weight:      Height:       No intake or output data in the 24 hours ending 07/18/18 1406 Filed Weights   07/16/18 2246  Weight: 132 kg (291 lb)    Examination: General appearance: obese adult male, lying in bed and in no acute distress.   HEENT: Anicteric, conjunctiva pink, lids and lashes normal. No nasal  deformity, discharge, epistaxis.  Lips moist, teeth normal, OP moist, no oral lesions, mallampati 4+, hearing normal.  Head wrapped for EEG.   Skin: Warm and dry.  no jaundice.  No suspicious rashes or lesions. Cardiac: RRR, nl S1-S2, no murmurs appreciated.  Capillary refill is brisk.  JVP not visible.  No LE edema.  Radia  pulses 2+ and symmetric. Respiratory: Normal respiratory rate and rhythm.  CTAB without rales or wheezes. Abdomen: Abdomen soft.  No focal TTP, no guarding. No ascites, distension; hepatosplenomegaly not possible to determine  given habitus.   MSK: No deformities or effusions of large joints of upper or lower extremities bilaterally, no clubbing. Neuro: Awake and makes eye contact.  EOMI, moves all extremities, symmetrically but weakly arms 5-/5 and legs 3/5 bilaterally, sensation intact to light touch, cranial nerves 3-12 intact. Speech fluent.    Psych: Sensorium intact and responding to questions, attention diminished. Affect flat.  Psychomotor slowing  Judgment and insight appear impaired.    Data Reviewed: I have personally reviewed following labs and imaging studies:  CBC: Recent Labs  Lab 07/16/18 1730 07/17/18 0424 07/18/18 1050  WBC 13.4* 11.5* 11.6*  NEUTROABS 9.2*  --  9.0*  HGB 17.0 16.1 17.3*  HCT 49.5 48.5 51.6  MCV 92.5 94.2 91.2  PLT 255 219 128   Basic Metabolic Panel: Recent Labs  Lab 07/16/18 1730 07/17/18 0424 07/18/18 1050  NA 142 144 140  K 3.7 4.7 4.1  CL 108 111 107  CO2 22 25 25   GLUCOSE 86 93 95  BUN 10 10 12   CREATININE 0.95 0.84 1.17  CALCIUM 9.6 9.3 9.6   GFR: Estimated Creatinine Clearance: 139.6 mL/min (by C-G formula based on SCr of 1.17 mg/dL). Liver Function Tests: Recent Labs  Lab 07/16/18 1730 07/17/18 0424  AST 69* 67*  ALT 106* 99*  ALKPHOS 63 54  BILITOT 0.9 1.3*  PROT 8.3* 7.3  ALBUMIN 4.5 4.2   Recent Labs  Lab 07/16/18 1730  LIPASE 27   Recent Labs  Lab 07/16/18 1932  AMMONIA 12   Coagulation Profile: No results for input(s): INR, PROTIME in the last 168 hours. Cardiac Enzymes: No results for input(s): CKTOTAL, CKMB, CKMBINDEX, TROPONINI in the last 168 hours. BNP (last 3 results) No results for input(s): PROBNP in the last 8760 hours. HbA1C: No results for input(s): HGBA1C in the last 72 hours. CBG: No results for input(s): GLUCAP in the last 168 hours. Lipid Profile: No results for input(s): CHOL, HDL, LDLCALC, TRIG, CHOLHDL, LDLDIRECT in the last 72 hours. Thyroid Function Tests: Recent Labs    07/16/18 2047  TSH  5.457*   Anemia Panel: Recent Labs    07/16/18 2047  VITAMINB12 305   Urine analysis:    Component Value Date/Time   COLORURINE YELLOW 07/16/2018 1730   APPEARANCEUR CLEAR 07/16/2018 1730   LABSPEC 1.038 (H) 07/16/2018 1730   PHURINE 7.0 07/16/2018 1730   GLUCOSEU NEGATIVE 07/16/2018 1730   HGBUR NEGATIVE 07/16/2018 1730   BILIRUBINUR NEGATIVE 07/16/2018 1730   KETONESUR 5 (A) 07/16/2018 1730   PROTEINUR NEGATIVE 07/16/2018 1730   NITRITE NEGATIVE 07/16/2018 1730   LEUKOCYTESUR NEGATIVE 07/16/2018 1730   Sepsis Labs: @LABRCNTIP (procalcitonin:4,lacticacidven:4)  ) Recent Results (from the past 240 hour(s))  MRSA PCR Screening     Status: None   Collection Time: 07/18/18  2:15 AM  Result Value Ref Range Status   MRSA by PCR NEGATIVE NEGATIVE Final    Comment:  The GeneXpert MRSA Assay (FDA approved for NASAL specimens only), is one component of a comprehensive MRSA colonization surveillance program. It is not intended to diagnose MRSA infection nor to guide or monitor treatment for MRSA infections. Performed at [facility], [place] [address], [place], [place] [address]          Radiology Studies: Dg Chest 2 View  Result Date: 07/16/2018 CLINICAL DATA:  Dyspnea EXAM: CHEST - 2 VIEW COMPARISON:  None. FINDINGS: No acute airspace disease or effusion. Normal heart size. Mild right paramediastinal opacity, suspected to represent vasculature. Abrupt scoliosis of the mid to upper thoracic spine with possible vertebral anomaly. IMPRESSION: No active cardiopulmonary disease. Electronically Signed   By: Donavan Foil M.D.   On: 07/16/2018 22:30   Ct Head Wo Contrast  Result Date: 07/16/2018 CLINICAL DATA:  Altered level of consciousness.  Autism EXAM: CT HEAD WITHOUT CONTRAST TECHNIQUE: Contiguous axial images were obtained from the base of the skull through the vertex without intravenous contrast. COMPARISON:  None. FINDINGS: Brain: The ventricles are normal in  size and configuration. There is no intracranial mass, hemorrhage, extra-axial fluid collection, or midline shift. Gray-white compartments appear normal. No acute infarct is evident. Vascular: No hyperdense vessel. No vascular calcifications are evident. Skull: The bony calvarium appears intact. Sinuses/Orbits: There is a small retention cyst in the anterior right maxillary antrum. Other paranasal sinuses which are visualized are clear. Orbits appear symmetric bilaterally. Other: Mastoid air cells are clear. IMPRESSION: Small retention cyst in the anterior right maxillary antrum. Study otherwise unremarkable. Electronically Signed   By: Lowella Grip III M.D.   On: 07/16/2018 19:01   Mr Brain Wo Contrast  Result Date: 07/17/2018 CLINICAL DATA:  Altered level of consciousness. EEG demonstrating potential LEFT temporal L LEFT epileptogenic focus. EXAM: MRI HEAD WITHOUT CONTRAST TECHNIQUE: Multiplanar, multiecho pulse sequences of the brain and surrounding structures were obtained without intravenous contrast. Axial T1, coronal T2 sequences not obtained. Patient did not wish to undergo further imaging. COMPARISON:  CT HEAD July 16, 2018. FINDINGS: Moderately motion degraded sagittal T1 sequence. INTRACRANIAL CONTENTS: No reduced diffusion to suggest acute ischemia or hyperacute demyelination. No susceptibility artifact to suggest hemorrhage. Borderline findings of microcephaly. No hydrocephalus. No suspicious parenchymal signal, masses, mass effect. No abnormal extra-axial fluid collections. No extra-axial masses. VASCULAR: Normal major intracranial vascular flow voids present at skull base. SKULL AND UPPER CERVICAL SPINE: No abnormal sellar expansion. No suspicious calvarial bone marrow signal. Craniocervical junction maintained. SINUSES/ORBITS: Maxillary mucosal retention cysts without paranasal sinus air-fluid levels. Mastoid air cells are well aerated.The included ocular globes and orbital contents are  non-suspicious. OTHER: Small cervical lymph nodes. IMPRESSION: No acute intracranial process on this limited MRI of the head. Borderline microcephaly. Electronically Signed   By: Elon Alas M.D.   On: 07/17/2018 19:04   US Abdomen Complete  Result Date: 07/18/2018 CLINICAL DATA:  Transaminitis.  Abdominal pain. EXAM: ABDOMEN ULTRASOUND COMPLETE COMPARISON:  07/17/2018. FINDINGS: Gallbladder: No gallstones or wall thickening visualized. No sonographic Murphy sign noted by sonographer. Common bile duct: Diameter: 4.4 mm Liver: Increased echogenicity consistent fatty infiltration or hepatocellular disease. Portal vein is patent on color Doppler imaging with normal direction of blood flow towards the liver. IVC: No abnormality visualized. Pancreas: Visualized portion unremarkable. Spleen: Size and appearance within normal limits. Right Kidney: Length: 10.2 cm. Echogenicity within normal limits. No mass or hydronephrosis visualized. Left Kidney: Length: 12.0 cm. Echogenicity within normal limits. No mass or hydronephrosis visualized. Abdominal aorta: No  aneurysm visualized. Other findings: None. IMPRESSION: 1. Increased hepatic echogenicity consistent fatty infiltration or hepatocellular disease. 2.  Exam otherwise unremarkable. Electronically Signed   By: Marcello Moores  Register   On: 07/18/2018 10:06   Ct Abdomen Pelvis W Contrast  Result Date: 07/16/2018 CLINICAL DATA:  Abdominal discomfort EXAM: CT ABDOMEN AND PELVIS WITH CONTRAST TECHNIQUE: Multidetector CT imaging of the abdomen and pelvis was performed using the standard protocol following bolus administration of intravenous contrast. CONTRAST:  <See Chart> ISOVUE-300 IOPAMIDOL (ISOVUE-300) INJECTION 61% COMPARISON:  None. FINDINGS: Lower chest: Lung bases are clear. Hepatobiliary: There is hepatic steatosis. No focal liver lesions are evident. Gallbladder wall is not appreciably thickened. There is no biliary duct dilatation. Pancreas: There is no  appreciable pancreatic mass or inflammatory focus. Spleen: No splenic lesions are evident. Adrenals/Urinary Tract: Adrenals bilaterally appear normal. Kidneys bilaterally show no evident mass or hydronephrosis on either side. There is no appreciable renal or ureteral calculus on either side. Urinary bladder is midline with wall thickness within normal limits. Stomach/Bowel: There is no appreciable bowel wall or mesenteric thickening. No evident bowel obstruction. No free air or portal venous air. There is moderate stool in the colon. Colon is not distended with stool. Vascular/Lymphatic: No abdominal aortic aneurysm. No vascular lesions are evident. There is no appreciable adenopathy in the abdomen or pelvis. Reproductive: Prostate and seminal vesicles are normal in size and contour. No evident pelvic mass. Other: Appendix appears normal. There is no abscess or ascites appreciable in the abdomen or pelvis. There is a minimal ventral hernia containing only fat. Musculoskeletal: No blastic or lytic bone lesions. No intramuscular or abdominal wall lesions are evident. IMPRESSION: 1. No evident bowel wall thickening or bowel obstruction. No abscess in the abdomen pelvis. Appendix appears normal. 2.  Hepatic steatosis without focal liver lesion evident. 3.  No renal or ureteral calculus.  No hydronephrosis. 4.  Rather minimal ventral hernia containing only fat. Electronically Signed   By: Lowella Grip III M.D.   On: 07/16/2018 19:05   Dg Abd Portable 1v  Result Date: 07/17/2018 CLINICAL DATA:  Three days of abdominal pain EXAM: PORTABLE ABDOMEN - 1 VIEW COMPARISON:  Abdominal and pelvic CT scan of July 16, 2018 FINDINGS: The colonic stool burden is moderate. No small or large bowel obstructive pattern is observed. There is a small amount of gas within the stomach. There are no abnormal soft tissue calcifications. IMPRESSION: Moderately increased colonic stool burden may reflect constipation in the appropriate  clinical setting. No evidence of obstruction or fecal impaction. Electronically Signed   By: David  Martinique M.D.   On: 07/17/2018 12:38        Scheduled Meds: . ARIPiprazole  2 mg Oral QPM  . fluvoxaMINE  50 mg Oral Daily  . guanFACINE  4 mg Oral Daily  . polyethylene glycol  17 g Oral Daily  . senna-docusate  1 tablet Oral BID  . vitamin B-12  2,000 mcg Oral Daily   Continuous Infusions:   LOS: 0 days    Time spent: 25 minutes    Edwin Dada, MD [facility] 07/18/2018, 2:06 PM     Pager [phone] --- please page though AMION:  [email or web address] If 7PM-7AM, please contact night-coverage

## 2018-07-18 NOTE — Progress Notes (Signed)
I agree with the assessmet performed by the previous nurse.

## 2018-07-19 DIAGNOSIS — Z79899 Other long term (current) drug therapy: Secondary | ICD-10-CM | POA: Diagnosis not present

## 2018-07-19 DIAGNOSIS — Q02 Microcephaly: Secondary | ICD-10-CM | POA: Diagnosis not present

## 2018-07-19 DIAGNOSIS — K760 Fatty (change of) liver, not elsewhere classified: Secondary | ICD-10-CM | POA: Diagnosis present

## 2018-07-19 DIAGNOSIS — K5900 Constipation, unspecified: Secondary | ICD-10-CM | POA: Diagnosis present

## 2018-07-19 DIAGNOSIS — E538 Deficiency of other specified B group vitamins: Secondary | ICD-10-CM | POA: Diagnosis present

## 2018-07-19 DIAGNOSIS — G9341 Metabolic encephalopathy: Principal | ICD-10-CM

## 2018-07-19 DIAGNOSIS — J45909 Unspecified asthma, uncomplicated: Secondary | ICD-10-CM | POA: Diagnosis present

## 2018-07-19 DIAGNOSIS — R0600 Dyspnea, unspecified: Secondary | ICD-10-CM | POA: Diagnosis present

## 2018-07-19 DIAGNOSIS — R1310 Dysphagia, unspecified: Secondary | ICD-10-CM | POA: Diagnosis present

## 2018-07-19 DIAGNOSIS — F909 Attention-deficit hyperactivity disorder, unspecified type: Secondary | ICD-10-CM | POA: Diagnosis present

## 2018-07-19 DIAGNOSIS — D72829 Elevated white blood cell count, unspecified: Secondary | ICD-10-CM | POA: Diagnosis present

## 2018-07-19 DIAGNOSIS — F840 Autistic disorder: Secondary | ICD-10-CM | POA: Diagnosis present

## 2018-07-19 DIAGNOSIS — F419 Anxiety disorder, unspecified: Secondary | ICD-10-CM | POA: Diagnosis present

## 2018-07-19 DIAGNOSIS — F329 Major depressive disorder, single episode, unspecified: Secondary | ICD-10-CM | POA: Diagnosis present

## 2018-07-19 DIAGNOSIS — E039 Hypothyroidism, unspecified: Secondary | ICD-10-CM | POA: Diagnosis present

## 2018-07-19 DIAGNOSIS — R400 Somnolence: Secondary | ICD-10-CM | POA: Diagnosis not present

## 2018-07-19 LAB — COMPREHENSIVE METABOLIC PANEL
ALBUMIN: 4.2 g/dL (ref 3.5–5.0)
ALK PHOS: 64 U/L (ref 38–126)
ALT: 101 U/L — ABNORMAL HIGH (ref 0–44)
AST: 59 U/L — ABNORMAL HIGH (ref 15–41)
Anion gap: 11 (ref 5–15)
BUN: 16 mg/dL (ref 6–20)
CHLORIDE: 108 mmol/L (ref 98–111)
CO2: 23 mmol/L (ref 22–32)
CREATININE: 1.1 mg/dL (ref 0.61–1.24)
Calcium: 9.9 mg/dL (ref 8.9–10.3)
GFR calc Af Amer: >60 mL/min (ref >60)
GFR calc non Af Amer: >60 mL/min (ref >60)
Glucose, Bld: 92 mg/dL (ref 70–99)
POTASSIUM: 4.2 mmol/L (ref 3.5–5.1)
SODIUM: 142 mmol/L (ref 135–145)
Total Bilirubin: 1 mg/dL (ref 0.3–1.2)
Total Protein: 8.2 g/dL — ABNORMAL HIGH (ref 6.5–8.1)

## 2018-07-19 LAB — CBC
HCT: 53.7 % — ABNORMAL HIGH (ref 39.0–52.0)
HEMOGLOBIN: 18.1 g/dL — AB (ref 13.0–17.0)
MCH: 31 pg (ref 26.0–34.0)
MCHC: 33.7 g/dL (ref 30.0–36.0)
MCV: 92.1 fL (ref 78.0–100.0)
PLATELETS: 237 10*3/uL (ref 150–400)
RBC: 5.83 MIL/uL — ABNORMAL HIGH (ref 4.22–5.81)
RDW: 12 % (ref 11.5–15.5)
WBC: 10.7 10*3/uL — ABNORMAL HIGH (ref 4.0–10.5)

## 2018-07-19 LAB — LIPID PANEL
CHOL/HDL RATIO: 5.5 ratio
Cholesterol: 187 mg/dL (ref 0–200)
HDL: 34 mg/dL — ABNORMAL LOW (ref >40)
LDL Cholesterol: 128 mg/dL — ABNORMAL HIGH (ref 0–99)
Triglycerides: 124 mg/dL (ref <150)
VLDL: 25 mg/dL (ref 0–40)

## 2018-07-19 LAB — HEMOGLOBIN A1C
HEMOGLOBIN A1C: 5.3 % (ref 4.8–5.6)
MEAN PLASMA GLUCOSE: 105.41 mg/dL

## 2018-07-19 LAB — T4, FREE: Free T4: 0.96 ng/dL (ref 0.82–1.77)

## 2018-07-19 MED ORDER — SODIUM CHLORIDE 0.9 % IV SOLN
INTRAVENOUS | Status: DC
  Administered 2018-07-19: 22:00:00 via INTRAVENOUS

## 2018-07-19 MED ORDER — POLYETHYLENE GLYCOL 3350 17 G PO PACK
17.0000 g | PACK | Freq: Two times a day (BID) | ORAL | Status: DC
  Administered 2018-07-19 – 2018-07-20 (×2): 17 g via ORAL
  Filled 2018-07-19 (×2): qty 1

## 2018-07-19 MED ORDER — ENOXAPARIN SODIUM 40 MG/0.4ML ~~LOC~~ SOLN
40.0000 mg | SUBCUTANEOUS | Status: DC
  Administered 2018-07-19: 40 mg via SUBCUTANEOUS
  Filled 2018-07-19: qty 0.4

## 2018-07-19 NOTE — Procedures (Signed)
  Video EEG Monitoring Report    Dates of monitoring:   07/18/17 @10 :05 to 07/19/17 @07 :30  Recording day:    1 (started on 7/23) EEG Number:    [id number] Requesting provider:   Dr Laurence SlateAroor Interpreting physician:  Delbert Phenixan-Andrei Deeann Servidio, MD  CPT:  [phone] ICD-10:  R41.82   Present History: 22 year old with autism spectrum disorder who presented for altered mental status with subacute behavioral changes.   EEG Classification  Normal, Awake and sleep   PDR  10-11 Hz, reactive and symmetric  HR  80 bpm and regular until ~21:00 when EKG lead stops recording reliably    Background abnormalities:   none    Periodic, rhythmic or epileptiform abnormalities:   none   Ictal phenomena:   none   EEG DETAILS:  TYPE OF RECORDING: At least 18-channel digital EEG with using a standard international 10-20 placement with additional EEG electrodes, and 1 additional channel dedicated to the EKG, at a sampling rate of at least 256 Hz. Video was recorded throughout the study. The recording was interpreted using digital review software allowing for montage reformatting, gain and filter changes. Each page was reviewed manually. Automatic spike and seizure detection software and quantitative analysis tools were used as needed.   Description of EEG features: During wakefulness normal voltage, mixed frequency rhythms are seen with a dominant alpha-theta background and superimposed beta activity which has normal amplitude. A posterior dominant rhythm of 10-11 Hz is observed, which is symmetric and attenuates normally with eye opening.   Drowsiness is accompanied by the usual EEG correlates including roving and then absent eye movements, progressively fewer eye blinks, waxing and waning PDR and disappearance of the PDR, generalized slow activity, and central theta activity.  Early sleep, especially, is characterized by broad field, very high amplitude, <1 Hz, sinusoid rhythmic delta which is most probably  hypersynchrony.   Normal stage II and slow wave sleep are recorded. Stage II is characterized by normal appearing, symmetric vertex waves, spindles and K-complexes. Slow wave sleep is distinguishable by high amplitude, diffuse, symmetric delta dominant background with mixed overriding faster activities.   Push Button Events: none  Interpretation: This is a normal awake and sleep continuous EEG. No epileptiform discharges or lateralizing signs are seen.

## 2018-07-19 NOTE — Progress Notes (Addendum)
Reason for consult:   Subjective: Patient is alert, however appears to have a flat affect.  He complains that he cannot feel his body and that it is numb.  Does not speak much when questions are asked.  Denies any auditory or visual hallucinations.  ROS: negative except above  Examination  Vital signs in last 24 hours: Temp:  [97.4 F (36.3 C)-98.6 F (37 C)] 98.6 F (37 C) (07/24 1715) Pulse Rate:  [82-96] 91 (07/24 1715) Resp:  [10-20] 20 (07/24 1715) BP: (111-131)/(74-96) 119/74 (07/24 1715) SpO2:  [93 %-98 %] 96 % (07/24 1715)  General: Not in distress, cooperative CVS: pulse-normal rate and rhythm RS: breathing comfortably Extremities: normal  Psych: Depressed  Neuro: MS: awake, oriented, follows commands, bradyphrenic CN: pupils equal and reactive,  EOMI, face symmetric, tongue midline, normal sensation over face, Motor: 5/5 strength in all 4 extremities Coordination: normal Gait: not tested  Basic Metabolic Panel: Recent Labs  Lab 07/16/18 1730 07/17/18 0424 07/18/18 1050 07/19/18 0302  NA 142 144 140 142  K 3.7 4.7 4.1 4.2  CL 108 111 107 108  CO2 22 25 25 23   GLUCOSE 86 93 95 92  BUN 10 10 12 16   CREATININE 0.95 0.84 1.17 1.10  CALCIUM 9.6 9.3 9.6 9.9    CBC: Recent Labs  Lab 07/16/18 1730 07/16/18 2319 07/17/18 0424 07/18/18 1050 07/19/18 0302  WBC 13.4*  --  11.5* 11.6* 10.7*  NEUTROABS 9.2*  --   --  9.0*  --   HGB 17.0  --  16.1 17.3* 18.1*  HCT 49.5 NOLAV 48.5 51.6 53.7*  MCV 92.5  --  94.2 91.2 92.1  PLT 255  --  219 233 237     Coagulation Studies: No results for input(s): LABPROT, INR in the last 72 hours.  Imaging Reviewed:     ASSESSMENT AND PLAN  22 year old with autism and group home presents to the ER after waking up with a foul smell and depressed behavior over the last few days.  He complains of fatigue, decreased appetite, generalized numbness over the entire body and has been laying in bed for most of the  day.  Neurology consulted  to rule out organic etiology for altered behavior.  Work-up included MRI and routine EEG.   R EEG showed left temporal lobe sharp wave, however 24hr EEG showed was normal. At this time I do not feel I can diagnose him with epilepsy. At the same time 24 hr EEG does not rule out epilepsy as he had no events.  If he has more episodes of acute confusion, then [facility]   Regarding abnormal behavior, this is not explained by EEG. Spoke with Dr Sharma Covert.  Patient's depressed mood, somatoform complaints are behavioral. I did discuss option of switching to another mood stabilizer such as lamotrigine which can also help with seizures if in fact he did have epilepsy.    Recommendations  Psychiatry to reconsult- patient's grandmother would like to speak before making medication changes. If patient's gaurdian agrees can start patient on lamotrigine starting at 25 mg daily BID, increase to 50mg  week2, then 50mg  and 25mg  week 3, 50mg  BID Week 4. Can increase further if needed.   Outpatient neurology follow up if patient has more episodes of sudden altered confusion.   Neurology will signoff.   Spent 40 min spent of this patient including more than half the time spent on counseling and discussing treatment options with grandmother.  Georgiana SpinnerSushanth Aroor [facility] Pager Number [phone] For questions after 7pm please refer to AMION to reach the Neurologist on call

## 2018-07-19 NOTE — Progress Notes (Signed)
PROGRESS NOTE  Cru Kritikos [id number]:[id number] DOB: January 13, 1996 DOA: 07/16/2018 PCP: Lin Landsman, MD  HPI/Recap of past 24 hours: [name] is a 22 y.o. M with autism and anxiety who presented with several days decreased mental status, staring episodes.  Per H&P: "First noted symptom was 2 days of "not feeling well" with decreased appetite. Then, at 3 AM yesterday, he woke up, went to the aide and told her that he was smelling something strange. He states now that it was a "sour" odor. No one else could smell it. Later, the patient became more withdrawn with significantly decreased verbal output. He also had been complaining of constipation. About one month ago, one of his psych meds, Rexulti was changed to Abilify; his appetite has been lower than normal since then. Pt had one prior episode similar to this one when he was 21, with blank staring and decreased verbal output, which resolved when Concerta was discontinued (it had been prescribed for ADHD). "   Today, pt refusing to answer all questions, denies any new complaints. Alert, oriented. Flat affect.    Assessment/Plan: Principal Problem:   Altered mental status  Acute metabolic encephalopathy  Alert, oriented MRI and CT brain normal.  Electrolytes and renal function normal No signs of sepsis/infectious encephalopathy TSH minimal elevation, free t4 pending ESR, RPR normal.  Ammonia normal 24H EEG unremarkable Psychiatry consulted  who do not feel he is psychotic or in a depressive episode Neurology on board, rec re-consulting psychiatry Will re-consult psych Continue B12 supplement Monitor closely  Constipation Continue MiraLAX, Senokot, tap water enema  Elevated liver enzymes Likely NASH, obese, on atypical antipsychotic, not a drinker US shows fatty liver Hep serologies negative, HIV negative Follow up with PCP  Anxiety Autism Continue Abilify (this is lowered dose, due to ?sedation) Continue Luvox,  guanfacine      Code Status: Full  Family Communication: Grandmother at bedside  Disposition Plan: TBD   Consultants:  Neurology   Psychiatry   Procedures:  24H EEG   Antimicrobials:  None  DVT prophylaxis:  Lovenox   Objective: Vitals:   07/19/18 1216 07/19/18 1548 07/19/18 1715 07/19/18 1947  BP: 124/80 119/82 119/74 120/81  Pulse: 82 93 91 89  Resp: 10  20 15   Temp: 98.2 F (36.8 C) 98.2 F (36.8 C) 98.6 F (37 C) (!) 97.4 F (36.3 C)  TempSrc: Oral Oral Oral Oral  SpO2: 96% 96% 96% 97%  Weight:      Height:        Intake/Output Summary (Last 24 hours) at 07/19/2018 2115 Last data filed at 07/19/2018 1600 Gross per 24 hour  Intake 240 ml  Output -  Net 240 ml   Filed Weights   07/16/18 2246  Weight: 132 kg (291 lb)    Exam:   General: NAD  Cardiovascular: S1,S2 present  Respiratory: CTAB   Abdomen: Soft, obese, NT, BS present   Musculoskeletal: No pedal edema  Skin: Normal  Psychiatry: Flat affect   Data Reviewed: CBC: Recent Labs  Lab 07/16/18 1730 07/16/18 2319 07/17/18 0424 07/18/18 1050 07/19/18 0302  WBC 13.4*  --  11.5* 11.6* 10.7*  NEUTROABS 9.2*  --   --  9.0*  --   HGB 17.0  --  16.1 17.3* 18.1*  HCT 49.5 NOLAV 48.5 51.6 53.7*  MCV 92.5  --  94.2 91.2 92.1  PLT 255  --  219 233 448   Basic Metabolic Panel: Recent Labs  Lab 07/16/18 1730 07/17/18 0424  07/18/18 1050 07/19/18 0302  NA 142 144 140 142  K 3.7 4.7 4.1 4.2  CL 108 111 107 108  CO2 22 25 25 23   GLUCOSE 86 93 95 92  BUN 10 10 12 16   CREATININE 0.95 0.84 1.17 1.10  CALCIUM 9.6 9.3 9.6 9.9   GFR: Estimated Creatinine Clearance: 148.5 mL/min (by C-G formula based on SCr of 1.1 mg/dL). Liver Function Tests: Recent Labs  Lab 07/16/18 1730 07/17/18 0424 07/19/18 0302  AST 69* 67* 59*  ALT 106* 99* 101*  ALKPHOS 63 54 64  BILITOT 0.9 1.3* 1.0  PROT 8.3* 7.3 8.2*  ALBUMIN 4.5 4.2 4.2   Recent Labs  Lab 07/16/18 1730  LIPASE 27    Recent Labs  Lab 07/16/18 1932  AMMONIA 12   Coagulation Profile: No results for input(s): INR, PROTIME in the last 168 hours. Cardiac Enzymes: No results for input(s): CKTOTAL, CKMB, CKMBINDEX, TROPONINI in the last 168 hours. BNP (last 3 results) No results for input(s): PROBNP in the last 8760 hours. HbA1C: Recent Labs    07/19/18 0302  HGBA1C 5.3   CBG: No results for input(s): GLUCAP in the last 168 hours. Lipid Profile: Recent Labs    07/19/18 0302  CHOL 187  HDL 34*  LDLCALC 128*  TRIG 124  CHOLHDL 5.5   Thyroid Function Tests: Recent Labs    07/19/18 1054  FREET4 0.96   Anemia Panel: No results for input(s): VITAMINB12, FOLATE, FERRITIN, TIBC, IRON, RETICCTPCT in the last 72 hours. Urine analysis:    Component Value Date/Time   COLORURINE YELLOW 07/16/2018 1730   APPEARANCEUR CLEAR 07/16/2018 1730   LABSPEC 1.038 (H) 07/16/2018 1730   PHURINE 7.0 07/16/2018 1730   GLUCOSEU NEGATIVE 07/16/2018 1730   HGBUR NEGATIVE 07/16/2018 1730   BILIRUBINUR NEGATIVE 07/16/2018 1730   KETONESUR 5 (A) 07/16/2018 1730   PROTEINUR NEGATIVE 07/16/2018 1730   NITRITE NEGATIVE 07/16/2018 1730   LEUKOCYTESUR NEGATIVE 07/16/2018 1730   Sepsis Labs: @LABRCNTIP (procalcitonin:4,lacticidven:4)  ) Recent Results (from the past 240 hour(s))  MRSA PCR Screening     Status: None   Collection Time: 07/18/18  2:15 AM  Result Value Ref Range Status   MRSA by PCR NEGATIVE NEGATIVE Final    Comment:        The GeneXpert MRSA Assay (FDA approved for NASAL specimens only), is one component of a comprehensive MRSA colonization surveillance program. It is not intended to diagnose MRSA infection nor to guide or monitor treatment for MRSA infections. Performed at [facility], [place] [address], [place],  [address]       Studies: No results found.  Scheduled Meds: . ARIPiprazole  2 mg Oral QPM  . enoxaparin (LOVENOX) injection  40 mg Subcutaneous Q24H  .  fluvoxaMINE  50 mg Oral Daily  . guanFACINE  4 mg Oral Daily  . polyethylene glycol  17 g Oral BID  . senna-docusate  1 tablet Oral BID  . vitamin B-12  2,000 mcg Oral Daily    Continuous Infusions:   LOS: 0 days     Alma Friendly, MD [facility]   If 7PM-7AM, please contact night-coverage [email or web address] Password [email or web address] 07/19/2018, 9:15 PM

## 2018-07-19 NOTE — Progress Notes (Signed)
vLTM EEG d/c per Dr. Aroor °

## 2018-07-19 NOTE — Progress Notes (Signed)
Tap water enema given with no positive results

## 2018-07-19 NOTE — Progress Notes (Signed)
OT Evaluation  Clinical Impression: PTA Pt living at group home at independent level. Pt is currently requiring single step cues and generally requiring min to mod A for ADL due to decreased initiation. Pt very flat affect throughout session - even with his favorite subject "classic rock" which his grandmother reports he loves to talk about. Pt will benefit from skilled OT in the acute setting to maximize safety and independence in ADL and functional transfers.     07/19/18 1100  OT Visit Information  Last OT Received On 07/19/18  Assistance Needed +1  History of Present Illness 22 y.o.malewith high fuctioning autism, living in a group home to the emergency room after waking up with a foul smell and  acute behavioral change.  An EEG obtained showed right temporal spike/sharp wave and there was epileptic genic. MRI brain obtained: microcephaly but no other abnormalities.  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 able to follow step-by-step verbal instructions "lift arm, hold cup, bring to your mouth,  drink" but cannot follow "have some water"  Upper Extremity Assessment  Upper Extremity Assessment Generalized weakness  Lower Extremity Assessment  Lower Extremity Assessment Defer to PT evaluation  ADL  Overall ADL's  Needs assistance/impaired  Eating/Feeding Minimal assistance;Cueing for sequencing;Sitting  Grooming Wash/dry face;Moderate assistance;Sitting;Cueing for sequencing  Grooming Details (indicate cue type and reason) when cued for initiation Pt replies "I can't" but after step by step verbal cues can complete  Upper Body Bathing Moderate assistance  Lower Body Bathing Moderate assistance  Upper Body Dressing  Minimal assistance;Cueing for sequencing;Sitting  Lower Body Dressing Moderate assistance;Sit to/from Doctor, general practicestand  Toilet Transfer Minimal assistance;Stand-pivot;BSC  Toilet Transfer Details (indicate cue type and reason) min A for balance  Toileting- ArchitectClothing Manipulation and Hygiene Min guard;Sit to/from stand  Functional mobility during ADLs Minimal assistance (SPT only)  General ADL Comments no initiation for ADL tasks  Vision- History  Baseline Vision/History Wears glasses  Wears Glasses At all times  Patient Visual Report No change from baseline  Bed Mobility  Overal bed mobility Needs Assistance  Bed Mobility Supine to Sit;Sit to Supine  Supine to sit Supervision  Sit to supine Supervision  General bed mobility comments supervision for safety  Transfers  Overall transfer level Needs assistance  Equipment used None  Transfers Stand Pivot Transfers;Sit to/from Stand  Sit to NiSourceStand Min guard  Stand pivot transfers Min guard  General transfer comment min assist for safety, Pt seemed off balance but did not require physical assist  Balance  Overall balance assessment Needs assistance  Sitting-balance support No upper extremity supported;Feet supported  Sitting balance-Leahy Scale Good  Standing balance support No upper extremity supported  Standing balance-Leahy  Scale Fair  General Comments  General comments (skin  integrity, edema, etc.) Grandmother present throughout session  OT - End of Session  Activity Tolerance Patient tolerated treatment well  Patient left in bed;with call bell/phone within reach;with bed alarm set;with family/visitor present  Nurse Communication Mobility status  OT Assessment  OT Recommendation/Assessment Patient needs continued OT Services  OT Visit Diagnosis Unsteadiness on feet (R26.81);Other abnormalities of gait and mobility (R26.89);Other symptoms and signs involving cognitive function  OT Problem List Impaired balance (sitting and/or standing);Decreased cognition  OT Plan  OT Frequency (ACUTE ONLY) Min 2X/week  OT Treatment/Interventions (ACUTE ONLY) Self-care/ADL training;Cognitive remediation/compensation  AM-PAC OT "6 Clicks" Daily Activity Outcome Measure  Help from another person eating meals? 2  Help from another person taking care of personal grooming? 2  Help from another person toileting, which includes using toliet, bedpan, or urinal? 3  Help from another person bathing (including washing, rinsing, drying)? 2  Help from another person to put on and taking off regular upper body clothing? 2  Help from another person to put on and taking off regular lower body clothing? 2  6 Click Score 13  ADL G Code Conversion CL  OT Recommendation  Follow Up Recommendations Supervision/Assistance - 24 hour (initially)  OT Equipment None recommended by OT  Acute Rehab OT Goals  Patient Stated Goal none stated "I can't think"  OT Time Calculation  OT Start Time (ACUTE ONLY) 0915  OT Stop Time (ACUTE ONLY) 0949  OT Time Calculation (min) 34 min  OT General Charges  $OT Visit 1 Visit  OT Evaluation  $OT Eval Moderate Complexity 1 Mod  OT Treatments  $Self Care/Home Management  8-22 mins  Written Expression  Dominant Hand Left   Sherryl Manges OTR/L [phone]

## 2018-07-19 NOTE — Evaluation (Signed)
Speech Language Pathology Evaluation Patient Details Name: Nicholas SenterMichael Scaletta MRN: [id number] DOB: 10-13-1996 Today's Date: 07/19/2018 Time: 0840-0900 SLP Time Calculation (min) (ACUTE ONLY): 20 min  Problem List:  Patient Active Problem List   Diagnosis Date Noted  . Altered mental status 07/16/2018   Past Medical History:  Past Medical History:  Diagnosis Date  . Autism spectrum disorder    Past Surgical History: History reviewed. No pertinent surgical history. HPI:  22 yo male with PMH significant for asthma, ADHD and autism spectrum disorder [facility] 2 days of decreased mental status, trouble with speech, and olfactory hallucinations, per family, and right upper quadrant abdominal pain. CT abd demonstrated evidence of fatty liver disease and abdominal XR demonstrated constipation. Neurology consulted for AMS. The [facility] for 24hr EEG monitoring to further assess for medical etiology behind decline in mental status and for additional abdominal workup to rule out acute abnormality. CT head WO contrast - Small retention cyst in the anterior right maxillary antrum. Study otherwise unremarkable. MR Brain WO contrast - No acute intracranial process on this limited MRI of the head. Borderline microcephaly. 24- hr EEG - didn't reveal any abnormalities.    Assessment / Plan / Recommendation Clinical Impression  Pt's grandmother is present and provides detailed history. Currently pt lives in group home and is completely independent with ADLs and has supervision for more complex tasks such as medication management. 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 He loves music and is very socially engaged. During this evaluation pt presents with no task initiation and has the appearance of no interest in tasks. He frequently states "I can't." However given verbal cues and physical  encouragement, he is able to perform tasks such as holding cup, drink and feed himself. He is also able to answer basic questions (sentence level) about his favorite singers/concerts at the [employer]ium and is oriented x 4.  Pt is very articulate about how he is feeling, he comments that his "mind just isn't thinking clearly" he states that he has "been on so many meds." He doesn't present with any word finding deficits, motor planning deficits or inability to perform tasks once heavily encouraged to perform tasks. At this time, it is difficult to determine differential diagnosis. ST to follow briefly.     SLP Assessment  SLP Recommendation/Assessment: Patient needs continued Speech Lanaguage Pathology Services SLP Visit Diagnosis: Cognitive communication deficit (R41.841)    Follow Up Recommendations  (Group home)    Frequency and Duration min 2x/week  2 weeks      SLP Evaluation Cognition  Overall Cognitive Status: Impaired/Different from baseline(see impressions statement for details) Arousal/Alertness: Awake/alert Orientation Level: Oriented X4 Attention: Sustained Sustained Attention: Appears intact       Comprehension  Auditory Comprehension Overall Auditory Comprehension: Appears within functional limits for tasks assessed Visual Recognition/Discrimination Discrimination: Not tested Reading Comprehension Reading Status: Not tested    Expression Expression Primary Mode of Expression: Verbal Verbal Expression Overall Verbal Expression: Appears within functional limits for tasks assessed Initiation: Impaired Non-Verbal Means of Communication: Not applicable Written Expression Dominant Hand: Left Written Expression: Not tested   Oral / Motor  Oral Motor/Sensory Function Overall Oral Motor/Sensory Function: Within functional limits Motor Speech Overall Motor Speech: Appears within functional limits for tasks assessed Respiration: Within functional limits Phonation:  Normal Resonance: Within functional limits Articulation: Within functional limitis Intelligibility: Intelligible Motor Planning: Witnin functional limits Motor  Speech Errors: Not applicable   GO                    Rodrickus Min 07/19/2018, 12:28 PM

## 2018-07-19 NOTE — Evaluation (Signed)
Physical Therapy Evaluation Patient Details Name: Nicholas Beard MRN: [id number] DOB: 02/17/1996 Today's Date: 07/19/2018   History of Present Illness  22 y.o.malewith high fuctioning autism, living in a group home to the emergency room after waking up with a foul smell and  acute behavioral change.  An EEG obtained showed right temporal spike/sharp wave and there was epileptic genic. MRI brain obtained: microcephaly but no other abnormalities.  Clinical Impression  Orders received for PT evaluation. Patient demonstrates deficits in functional mobility as indicated below. Will benefit from continued skilled PT to address deficits and maximize function. Will see as indicated and progress as tolerated.      Follow Up Recommendations No PT follow up;Supervision - Intermittent    Equipment Recommendations  None recommended by PT    Recommendations for Other Services       Precautions / Restrictions Precautions Precautions: Other (comment)(24 hour EEG) Restrictions Weight Bearing Restrictions: No      Mobility  Bed Mobility Overal bed mobility: Needs Assistance Bed Mobility: Supine to Sit;Sit to Supine     Supine to sit: Supervision Sit to supine: Supervision   General bed mobility comments: supervision for safety  Transfers Overall transfer level: Needs assistance Equipment used: None Transfers: Stand Pivot Transfers Sit to Stand: Supervision         General transfer comment: supervision for safety, no physical assist. good timing to elevate to standing  Ambulation/Gait Ambulation/Gait assistance: Supervision Gait Distance (Feet): 210 Feet Assistive device: None Gait Pattern/deviations: WFL(Within Functional Limits) Gait velocity: decreased Gait velocity interpretation: 1.31 - 2.62 ft/sec, indicative of limited community ambulator General Gait Details: modest instability and decreased speed. no overt LOB  Stairs            Wheelchair Mobility     Modified Rankin (Stroke Patients Only)       Balance Overall balance assessment: Needs assistance Sitting-balance support: No upper extremity supported;Feet supported Sitting balance-Leahy Scale: Good     Standing balance support: No upper extremity supported Standing balance-Leahy Scale: Fair               High level balance activites: Side stepping;Backward walking;Direction changes;Turns;Sudden stops;Head turns High Level Balance Comments: supervision for activity, no overt LOB. 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), and Cabin crew Dominance   Dominant Hand: Left    Extremity/Trunk Assessment   Upper Extremity Assessment Upper Extremity Assessment: Generalized weakness    Lower Extremity Assessment Lower Extremity Assessment: Generalized weakness       Communication   Communication: No difficulties  Cognition Arousal/Alertness: Awake/alert Behavior During Therapy: Flat affect Overall Cognitive Status: Impaired/Different from baseline Area of Impairment: Safety/judgement;Problem solving                         Safety/Judgement: Decreased awareness of safety   Problem Solving: Difficulty sequencing;Requires verbal cues General Comments: Patient able to follow simple commands consistently  and complex commands with increased time      General Comments      Exercises     Assessment/Plan    PT Assessment Patient needs continued PT services  PT Problem List Decreased activity tolerance;Decreased mobility       PT Treatment  Interventions DME instruction;Gait training;Stair training;Functional mobility training;Therapeutic activities;Therapeutic exercise;Balance training;Neuromuscular re-education;Cognitive remediation;Patient/family education    PT Goals (Current goals can be found in the Care Plan section)  Acute Rehab PT Goals Patient Stated Goal: to go home PT Goal Formulation: With patient Time For Goal Achievement: 08/02/18 Potential to Achieve Goals: Good    Frequency Min 3X/week   Barriers to discharge        Co-evaluation               AM-PAC PT "6 Clicks" Daily Activity  Outcome Measure Difficulty turning over in bed (including adjusting bedclothes, sheets and blankets)?: None Difficulty moving from lying on back to sitting on the side of the bed? : None Difficulty sitting down on and standing up from a chair with arms (e.g., wheelchair, bedside commode, etc,.)?: None Help needed moving to and from a bed to chair (including a wheelchair)?: A Little Help needed walking in hospital room?: A Little Help needed climbing 3-5 steps with a railing? : A Little 6 Click Score: 21    End of Session Equipment Utilized During Treatment: Gait belt Activity Tolerance: Patient tolerated treatment well Patient left: in bed;with call bell/phone within reach;with bed alarm set;with family/visitor present Nurse Communication: Mobility status PT Visit Diagnosis: Unsteadiness on feet (R26.81);Other symptoms and signs involving the nervous system (R29.898)    Time: [id number] PT Time Calculation (min) (ACUTE ONLY): 18 min   Charges:   PT Evaluation $PT Eval Moderate Complexity: 1 Mod     PT G Codes:        Charlotte Crumbevon Yazhini Mcaulay, PT DPT  Board Certified Neurologic Specialist [phone]   Fabio AsaDevon J Codylee Patil 07/19/2018, 4:23 PM

## 2018-07-20 DIAGNOSIS — K5900 Constipation, unspecified: Secondary | ICD-10-CM

## 2018-07-20 LAB — BASIC METABOLIC PANEL
ANION GAP: 11 (ref 5–15)
BUN: 17 mg/dL (ref 6–20)
CHLORIDE: 106 mmol/L (ref 98–111)
CO2: 24 mmol/L (ref 22–32)
Calcium: 9.3 mg/dL (ref 8.9–10.3)
Creatinine, Ser: 1.31 mg/dL — ABNORMAL HIGH (ref 0.61–1.24)
GFR calc Af Amer: >60 mL/min (ref >60)
GFR calc non Af Amer: >60 mL/min (ref >60)
Glucose, Bld: 93 mg/dL (ref 70–99)
POTASSIUM: 3.9 mmol/L (ref 3.5–5.1)
Sodium: 141 mmol/L (ref 135–145)

## 2018-07-20 LAB — CBC WITH DIFFERENTIAL/PLATELET
ABS IMMATURE GRANULOCYTES: 0 10*3/uL (ref 0.0–0.1)
Basophils Absolute: 0.1 10*3/uL (ref 0.0–0.1)
Basophils Relative: 1 %
Eosinophils Absolute: 0.1 10*3/uL (ref 0.0–0.7)
Eosinophils Relative: 1 %
HEMATOCRIT: 51.9 % (ref 39.0–52.0)
Hemoglobin: 17.6 g/dL — ABNORMAL HIGH (ref 13.0–17.0)
IMMATURE GRANULOCYTES: 0 %
LYMPHS ABS: 2.7 10*3/uL (ref 0.7–4.0)
Lymphocytes Relative: 23 %
MCH: 30.9 pg (ref 26.0–34.0)
MCHC: 33.9 g/dL (ref 30.0–36.0)
MCV: 91.2 fL (ref 78.0–100.0)
MONOS PCT: 6 %
Monocytes Absolute: 0.7 10*3/uL (ref 0.1–1.0)
Neutro Abs: 8.2 10*3/uL — ABNORMAL HIGH (ref 1.7–7.7)
Neutrophils Relative %: 69 %
Platelets: 232 10*3/uL (ref 150–400)
RBC: 5.69 MIL/uL (ref 4.22–5.81)
RDW: 11.9 % (ref 11.5–15.5)
WBC: 11.8 10*3/uL — ABNORMAL HIGH (ref 4.0–10.5)

## 2018-07-20 NOTE — Care Management Note (Signed)
Case Management Note  Patient Details  Name: Nicholas SenterMichael Stagliano MRN: [id number] Date of Birth: Feb 12, 1996  Subjective/Objective:                    Action/Plan: Pt discharging to his Grandmother who [facility] Patient has been seen at [facility] at the autism clinic previously.  Grandmother providing transportation.   Expected Discharge Date:  07/20/18               Expected Discharge Plan:  Group Home  In-House Referral:  Clinical Social Work  Discharge planning Services  CM Consult  Post Acute Care Choice:    Choice offered to:  Patient  DME Arranged:    DME Agency:     HH Arranged:    HH Agency:     Status of Service:  Completed, signed off  If discussed at [employer] Meetings, dates discussed:    Additional Comments:  Kermit BaloKelli F Trenice Mesa, RN 07/20/2018, 2:16 PM

## 2018-07-20 NOTE — Plan of Care (Signed)
Patient stable, discussed POC with patient and caregiver, agreeable with plan.

## 2018-07-20 NOTE — Progress Notes (Addendum)
Occupational Therapy Treatment Patient Details Name: Nicholas SenterMichael Beard MRN: [id number] DOB: 1996-11-06 Today's Date: 07/20/2018    History of present illness 21 y.o.malewith high fuctioning autism, living in a group home to the emergency room after waking up with a foul smell and  acute behavioral change.  An EEG obtained showed right temporal spike/sharp wave and there was epileptic genic. MRI brain obtained: microcephaly but no other abnormalities.   OT comments  Pt presents sitting EOB agreeable to working with OT. Pt continues to demonstrate decreased initiation with functional tasks, often stating "I can't" and requiring encouragement as well as step by step cues for sequencing through seated grooming ADLs. [facility] however pt not able to achieve full standing position despite encouragement and providing mod-maxA for sit<>stand. Question whether partly due to pt's overall affect with functional tasks this session in addition to general weakness. Will continue to follow acutely to progress pt towards established OT goals and to further assess for ADL and mobility needs.    Follow Up Recommendations  Supervision/Assistance - 24 hour    Equipment Recommendations  None recommended by OT;Other (comment)(to be further assessed )          Precautions / Restrictions Precautions Precautions: Fall Restrictions Weight Bearing Restrictions: No       Mobility Bed Mobility Overal bed mobility: Needs Assistance Bed Mobility: Sit to Supine       Sit to supine: Supervision   General bed mobility comments: supervision for safety; VCs to self[facility]   Transfers Overall transfer level: Needs assistance Equipment used: Rolling walker (2 wheeled)   Sit to Stand: Mod assist         General transfer comment: pt initially reporting "I can't" when attempting to stand, with encouragement pt agreeable to attempt sit<>stand at RW; VCs for hand placement  and pt achieving partial standing with mod-maxA, despite assist pt not able to reach full standing position and initiates return to sitting; pt declining additional attempts, also deferred due to safety concerns with +1 assist    Balance Overall balance assessment: Needs assistance Sitting-balance support: No upper extremity supported;Feet supported Sitting balance-Leahy Scale: Good     Standing balance support: Bilateral upper extremity supported Standing balance-Leahy Scale: Poor                             ADL either performed or assessed with clinical judgement   ADL Overall ADL's : Needs assistance/impaired     Grooming: Wash/dry face;Moderate assistance;Sitting;Cueing for sequencing Grooming Details (indicate cue type and reason): when cued for initiation Pt replies "I can't" but after step by step verbal/tactile cues can complete; completed sitting EOB                                General ADL Comments: pt continues to demonstrate decreased initiation for ADL tasks; attempted sit<>Stand at RW this session, pt reporting "I can't", able to achieve partial standing at RW with mod-maxA and encouragement for attempt but does not reach full standing posture and initiates return to sitting; question whether partly due to pt increasingly anxious with mobility? vs general weakness      Vision       Perception     Praxis      Cognition Arousal/Alertness: Awake/alert Behavior During Therapy: Flat affect Overall Cognitive Status: Impaired/Different from baseline Area of Impairment:  Safety/judgement;Problem solving                         Safety/Judgement: Decreased awareness of safety   Problem Solving: Difficulty sequencing;Requires verbal cues General Comments: follows simple commands given increased time and encouragement; pt often stating "I can't" during grooming ADL tasks requiring step-by-step cues and instruction         Exercises      Shoulder Instructions       General Comments      Pertinent Vitals/ Pain       Pain Assessment: No/denies pain  Home Living                                          Prior Functioning/Environment              Frequency  Min 2X/week        Progress Toward Goals  OT Goals(current goals can now be found in the care plan section)  Progress towards OT goals: Progressing toward goals  Acute Rehab OT Goals Patient Stated Goal: to go home  Plan Discharge plan remains appropriate    Co-evaluation                 AM-PAC PT "6 Clicks" Daily Activity     Outcome Measure   Help from another person eating meals?: A Lot Help from another person taking care of personal grooming?: A Lot Help from another person toileting, which includes using toliet, bedpan, or urinal?: A Lot Help from another person bathing (including washing, rinsing, drying)?: A Lot Help from another person to put on and taking off regular upper body clothing?: A Lot Help from another person to put on and taking off regular lower body clothing?: A Lot 6 Click Score: 12    End of Session Equipment Utilized During Treatment: Gait belt;Rolling walker  OT Visit Diagnosis: Unsteadiness on feet (R26.81);Other abnormalities of gait and mobility (R26.89);Other symptoms and signs involving cognitive function   Activity Tolerance Patient tolerated treatment well   Patient Left in bed;with call bell/phone within reach;with bed alarm set;with family/visitor present   Nurse Communication Mobility status        Time: 1150-1209 OT Time Calculation (min): 19 min  Charges: OT General Charges $OT Visit: 1 Visit OT Treatments $Self Care/Home Management : 8-22 mins  Marcy Siren, OT Pager [phone] 07/20/2018    Orlando Penner 07/20/2018, 2:25 PM

## 2018-07-20 NOTE — Discharge Summary (Signed)
Discharge Summary  Nicholas Beard RCV:[id number] DOB: September 22, 1996  PCP: Lin Landsman, MD  Admit date: 07/16/2018 Discharge date: 07/20/2018  Time spent: 45 mins  Recommendations for Outpatient Follow-up:  1. PCP 2. Neurology/psychiatry prn 3. [facility]   Discharge Diagnoses:  Active Hospital Problems   Diagnosis Date Noted  . Altered mental status 07/16/2018    Resolved Hospital Problems  No resolved problems to display.    Discharge Condition: Stable  Diet recommendation: Regular  Vitals:   07/20/18 1121 07/20/18 1236  BP:  119/87  Pulse: 60 93  Resp: 18 18  Temp: 98.3 F (36.8 C) 98.7 F (37.1 C)  SpO2: 100% 95%    History of present illness:  Nicholas Beard a 22 y.o.Mwith hx of autism and anxiety who presented with several days decreased mental status, staring episodes. Per H&P: "First noted symptom was 2 days of "not feeling well" with decreased appetite. Then, at 3 AM yesterday, he woke up, went to the aide and told her that he was smelling something strange. He states now that it was a "sour" odor. No one else could smell it. Later, the patient became more withdrawn with significantly decreased verbal output. He also had been complaining of constipation. About one month ago, one of his psych meds, Rexulti was changed to Abilify; his appetite has been lower than normal since then. Pt had one prior episode similar to this one when he was 5, with blank staring and decreased verbal output, which resolved when Concerta was discontinued (it had been prescribed for ADHD)."   Today, pt denies any complaints, still refusing to eat stating he cant swallow despite speech confirming swallow is effective. Still feel numb all over his body. Able to ambulate to the bathroom and back with assistance to urinate, but hasnt had any BM yet despite being on the multiple laxatives.  Had an extensive conversation with grandmother who reported prior to hospitalization,  pt was high functioning, lived in a group home, independent of ADLs even went surfing a few weeks ago. Neurology and psychiatry was consulted and no formal medical diagnosis was found, including a normal 24/overnight EEG monitoring. Pt was previously followed by [facility], but hasnt seen them in years. Grandmother insisted she would want to transfer care over to [facility]. Grandmother placed several calls to people she knew at the autism center and was told to go through the ER at [facility]. I requested a contact number of which she provided #[phone]. I tried the number a couple of times to know what the plan is, but no answer.  Currently neurology has signed off, pt is medically stable including vital signs and labs. No complaints, pt looks comfortable. No further work up recommended at this time and pt will be discharged. Grandmother insisting to take pt to [facility] for further management.    Hospital Course:  Principal Problem:   Altered mental status   Acute metabolic encephalopathy  Alert, oriented, flat affect MRI and CT brain normal. Electrolytes and renal function normal No signs of sepsis/infectious encephalopathy TSH minimal elevation, free t4 normal ESR, RPR normal. Ammonia normal 24H EEG unremarkable Psychiatry consulted  who do not feel he is psychotic or in a depressive episode Neurology on board, no further recommendation Follow up with PCP, [facility], [facility]  Constipation S/P MiraLAX, Senokot, tap water enema  Elevated liver enzymes Likely NASH, obese, on atypical antipsychotic, not a drinker US shows fatty liver Hep serologies  negative, HIV negative Follow up with PCP  Anxiety Autism ContinueAbilify ContinueLuvox, guanfacine     Procedures:  24H EEG  Consultations:  Neurology  Psychiatry  Discharge Exam: BP 119/87 (BP Location: Right Arm)   Pulse 93   Temp 98.7 F (37.1 C) (Oral)   Resp 18   Ht 5' 11"   (1.803 m)   Wt 132 kg (291 lb)   SpO2 95%   BMI 40.59 kg/m   General: NAD, flat affect Cardiovascular: S1, S2 present  Respiratory: CTAB  Discharge Instructions You were cared for by a hospitalist during your hospital stay. If you have any questions about your discharge medications or the care you received while you were in the hospital after you are discharged, you can call the unit and asked to speak with the hospitalist on call if the hospitalist that took care of you is not available. Once you are discharged, your primary care physician will handle any further medical issues. Please note that NO REFILLS for any discharge medications will be authorized once you are discharged, as it is imperative that you return to your primary care physician (or establish a relationship with a primary care physician if you do not have one) for your aftercare needs so that they can reassess your need for medications and monitor your lab values.   Allergies as of 07/20/2018      Reactions   Other Hives   Bug  bites      Medication List    TAKE these medications   ARIPiprazole 5 MG tablet Commonly known as:  ABILIFY Take 5 mg by mouth every evening.   fluvoxaMINE 50 MG tablet Commonly known as:  LUVOX Take 50 mg by mouth daily.   guanFACINE 4 MG Tb24 ER tablet Commonly known as:  INTUNIV Take 4 mg by mouth daily.      Allergies  Allergen Reactions  . Other Hives    Bug  bites   Follow-up Information    Lin Landsman, MD. Schedule an appointment as soon as possible for a visit in 1 week(s).   Specialty:  Family Medicine Contact information: [place] [address] [phone]            The results of significant diagnostics from this hospitalization (including imaging, microbiology, ancillary and laboratory) are listed below for reference.    Significant Diagnostic Studies: Dg Chest 2 View  Result Date: 07/16/2018 CLINICAL DATA:  Dyspnea EXAM: CHEST - 2  VIEW COMPARISON:  None. FINDINGS: No acute airspace disease or effusion. Normal heart size. Mild right paramediastinal opacity, suspected to represent vasculature. Abrupt scoliosis of the mid to upper thoracic spine with possible vertebral anomaly. IMPRESSION: No active cardiopulmonary disease. Electronically Signed   By: Donavan Foil M.D.   On: 07/16/2018 22:30   Ct Head Wo Contrast  Result Date: 07/16/2018 CLINICAL DATA:  Altered level of consciousness.  Autism EXAM: CT HEAD WITHOUT CONTRAST TECHNIQUE: Contiguous axial images were obtained from the base of the skull through the vertex without intravenous contrast. COMPARISON:  None. FINDINGS: Brain: The ventricles are normal in size and configuration. There is no intracranial mass, hemorrhage, extra-axial fluid collection, or midline shift. Gray-white compartments appear normal. No acute infarct is evident. Vascular: No hyperdense vessel. No vascular calcifications are evident. Skull: The bony calvarium appears intact. Sinuses/Orbits: There is a small retention cyst in the anterior right maxillary antrum. Other paranasal sinuses which are visualized are clear. Orbits appear symmetric bilaterally. Other: Mastoid air cells  are clear. IMPRESSION: Small retention cyst in the anterior right maxillary antrum. Study otherwise unremarkable. Electronically Signed   By: Lowella Grip III M.D.   On: 07/16/2018 19:01   Mr Brain Wo Contrast  Result Date: 07/17/2018 CLINICAL DATA:  Altered level of consciousness. EEG demonstrating potential LEFT temporal L LEFT epileptogenic focus. EXAM: MRI HEAD WITHOUT CONTRAST TECHNIQUE: Multiplanar, multiecho pulse sequences of the brain and surrounding structures were obtained without intravenous contrast. Axial T1, coronal T2 sequences not obtained. Patient did not wish to undergo further imaging. COMPARISON:  CT HEAD July 16, 2018. FINDINGS: Moderately motion degraded sagittal T1 sequence. INTRACRANIAL CONTENTS: No reduced  diffusion to suggest acute ischemia or hyperacute demyelination. No susceptibility artifact to suggest hemorrhage. Borderline findings of microcephaly. No hydrocephalus. No suspicious parenchymal signal, masses, mass effect. No abnormal extra-axial fluid collections. No extra-axial masses. VASCULAR: Normal major intracranial vascular flow voids present at skull base. SKULL AND UPPER CERVICAL SPINE: No abnormal sellar expansion. No suspicious calvarial bone marrow signal. Craniocervical junction maintained. SINUSES/ORBITS: Maxillary mucosal retention cysts without paranasal sinus air-fluid levels. Mastoid air cells are well aerated.The included ocular globes and orbital contents are non-suspicious. OTHER: Small cervical lymph nodes. IMPRESSION: No acute intracranial process on this limited MRI of the head. Borderline microcephaly. Electronically Signed   By: Elon Alas M.D.   On: 07/17/2018 19:04   US Abdomen Complete  Result Date: 07/18/2018 CLINICAL DATA:  Transaminitis.  Abdominal pain. EXAM: ABDOMEN ULTRASOUND COMPLETE COMPARISON:  07/17/2018. FINDINGS: Gallbladder: No gallstones or wall thickening visualized. No sonographic Murphy sign noted by sonographer. Common bile duct: Diameter: 4.4 mm Liver: Increased echogenicity consistent fatty infiltration or hepatocellular disease. Portal vein is patent on color Doppler imaging with normal direction of blood flow towards the liver. IVC: No abnormality visualized. Pancreas: Visualized portion unremarkable. Spleen: Size and appearance within normal limits. Right Kidney: Length: 10.2 cm. Echogenicity within normal limits. No mass or hydronephrosis visualized. Left Kidney: Length: 12.0 cm. Echogenicity within normal limits. No mass or hydronephrosis visualized. Abdominal aorta: No aneurysm visualized. Other findings: None. IMPRESSION: 1. Increased hepatic echogenicity consistent fatty infiltration or hepatocellular disease. 2.  Exam otherwise unremarkable.  Electronically Signed   By: Marcello Moores  Register   On: 07/18/2018 10:06   Ct Abdomen Pelvis W Contrast  Result Date: 07/16/2018 CLINICAL DATA:  Abdominal discomfort EXAM: CT ABDOMEN AND PELVIS WITH CONTRAST TECHNIQUE: Multidetector CT imaging of the abdomen and pelvis was performed using the standard protocol following bolus administration of intravenous contrast. CONTRAST:  <See Chart> ISOVUE-300 IOPAMIDOL (ISOVUE-300) INJECTION 61% COMPARISON:  None. FINDINGS: Lower chest: Lung bases are clear. Hepatobiliary: There is hepatic steatosis. No focal liver lesions are evident. Gallbladder wall is not appreciably thickened. There is no biliary duct dilatation. Pancreas: There is no appreciable pancreatic mass or inflammatory focus. Spleen: No splenic lesions are evident. Adrenals/Urinary Tract: Adrenals bilaterally appear normal. Kidneys bilaterally show no evident mass or hydronephrosis on either side. There is no appreciable renal or ureteral calculus on either side. Urinary bladder is midline with wall thickness within normal limits. Stomach/Bowel: There is no appreciable bowel wall or mesenteric thickening. No evident bowel obstruction. No free air or portal venous air. There is moderate stool in the colon. Colon is not distended with stool. Vascular/Lymphatic: No abdominal aortic aneurysm. No vascular lesions are evident. There is no appreciable adenopathy in the abdomen or pelvis. Reproductive: Prostate and seminal vesicles are normal in size and contour. No evident pelvic mass. Other: Appendix appears normal. There is no abscess  or ascites appreciable in the abdomen or pelvis. There is a minimal ventral hernia containing only fat. Musculoskeletal: No blastic or lytic bone lesions. No intramuscular or abdominal wall lesions are evident. IMPRESSION: 1. No evident bowel wall thickening or bowel obstruction. No abscess in the abdomen pelvis. Appendix appears normal. 2.  Hepatic steatosis without focal liver lesion  evident. 3.  No renal or ureteral calculus.  No hydronephrosis. 4.  Rather minimal ventral hernia containing only fat. Electronically Signed   By: Lowella Grip III M.D.   On: 07/16/2018 19:05   Dg Abd Portable 1v  Result Date: 07/17/2018 CLINICAL DATA:  Three days of abdominal pain EXAM: PORTABLE ABDOMEN - 1 VIEW COMPARISON:  Abdominal and pelvic CT scan of July 16, 2018 FINDINGS: The colonic stool burden is moderate. No small or large bowel obstructive pattern is observed. There is a small amount of gas within the stomach. There are no abnormal soft tissue calcifications. IMPRESSION: Moderately increased colonic stool burden may reflect constipation in the appropriate clinical setting. No evidence of obstruction or fecal impaction. Electronically Signed   By: David  Martinique M.D.   On: 07/17/2018 12:38    Microbiology: Recent Results (from the past 240 hour(s))  MRSA PCR Screening     Status: None   Collection Time: 07/18/18  2:15 AM  Result Value Ref Range Status   MRSA by PCR NEGATIVE NEGATIVE Final    Comment:        The GeneXpert MRSA Assay (FDA approved for NASAL specimens only), is one component of a comprehensive MRSA colonization surveillance program. It is not intended to diagnose MRSA infection nor to guide or monitor treatment for MRSA infections. Performed at [facility], [place] [address] [place], [place] [address]      Labs: Basic Metabolic Panel: Recent Labs  Lab 07/16/18 1730 07/17/18 0424 07/18/18 1050 07/19/18 0302 07/20/18 0407  NA 142 144 140 142 141  K 3.7 4.7 4.1 4.2 3.9  CL 108 111 107 108 106  CO2 22 25 25 23 24   GLUCOSE 86 93 95 92 93  BUN 10 10 12 16 17   CREATININE 0.95 0.84 1.17 1.10 1.31*  CALCIUM 9.6 9.3 9.6 9.9 9.3   Liver Function Tests: Recent Labs  Lab 07/16/18 1730 07/17/18 0424 07/19/18 0302  AST 69* 67* 59*  ALT 106* 99* 101*  ALKPHOS 63 54 64  BILITOT 0.9 1.3* 1.0  PROT 8.3* 7.3 8.2*  ALBUMIN 4.5 4.2 4.2   Recent  Labs  Lab 07/16/18 1730  LIPASE 27   Recent Labs  Lab 07/16/18 1932  AMMONIA 12   CBC: Recent Labs  Lab 07/16/18 1730 07/16/18 2319 07/17/18 0424 07/18/18 1050 07/19/18 0302 07/20/18 0407  WBC 13.4*  --  11.5* 11.6* 10.7* 11.8*  NEUTROABS 9.2*  --   --  9.0*  --  8.2*  HGB 17.0  --  16.1 17.3* 18.1* 17.6*  HCT 49.5 NOLAV 48.5 51.6 53.7* 51.9  MCV 92.5  --  94.2 91.2 92.1 91.2  PLT 255  --  219 233 237 232   Cardiac Enzymes: No results for input(s): CKTOTAL, CKMB, CKMBINDEX, TROPONINI in the last 168 hours. BNP: BNP (last 3 results) No results for input(s): BNP in the last 8760 hours.  ProBNP (last 3 results) No results for input(s): PROBNP in the last 8760 hours.  CBG: No results for input(s): GLUCAP in the last 168 hours.     Signed:  Alma Friendly, MD [facility] 07/20/2018, 1:42 PM

## 2019-04-16 IMAGING — CT CT ABD-PELV W/ CM
2 of 4 series · 16 of 46 positions shown, 18 images · IV contrast (ISOVUE)
Comparison: None.

CLINICAL DATA: Abdominal discomfort

EXAM:
CT ABDOMEN AND PELVIS WITH CONTRAST
TECHNIQUE: Multidetector CT imaging of the abdomen and pelvis was performed
using the standard protocol following bolus administration of
intravenous contrast.
CONTRAST:  <See Chart> 3X6MLT-UDD IOPAMIDOL (3X6MLT-UDD) INJECTION
61%

[Series 2: axial st · axial · 0.98mm/px · z∈[-498,-58]mm · 13 of 100 slices shown, 15 images]
[im 6/100  soft-tissue]
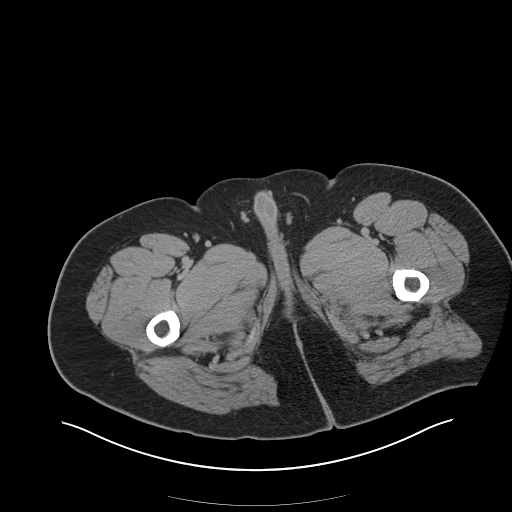
[im 6/100  bone]
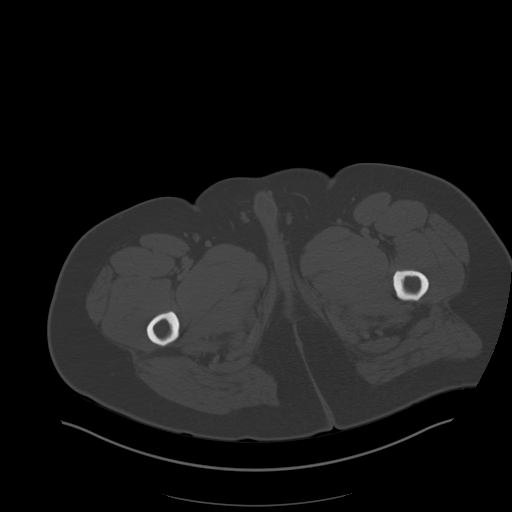
[im 12/100  soft-tissue]
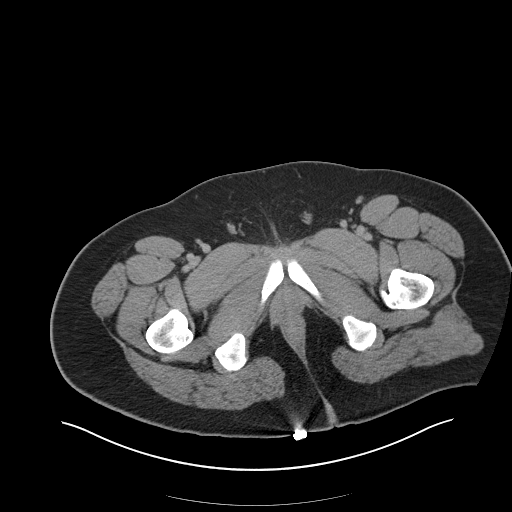
[im 23/100  soft-tissue]
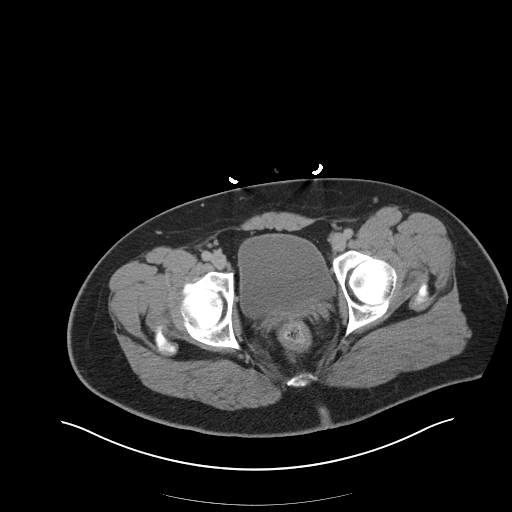
[im 28/100  soft-tissue]
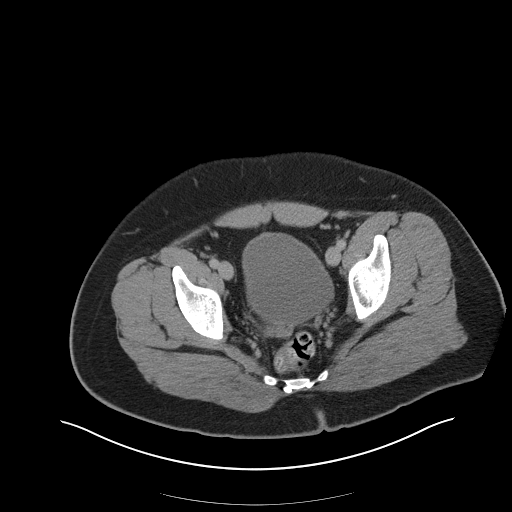
[im 34/100  soft-tissue]
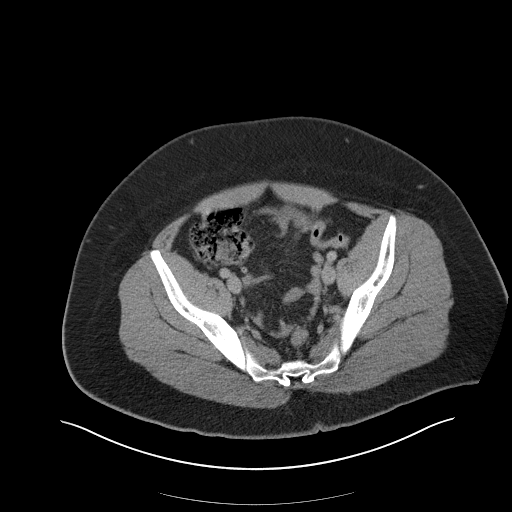
[im 45/100  soft-tissue]
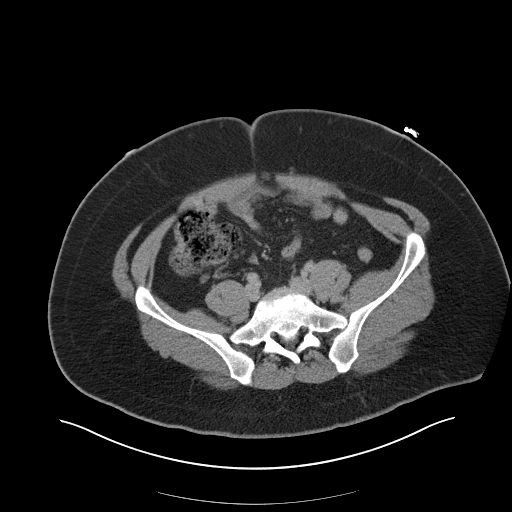
[im 50/100  soft-tissue]
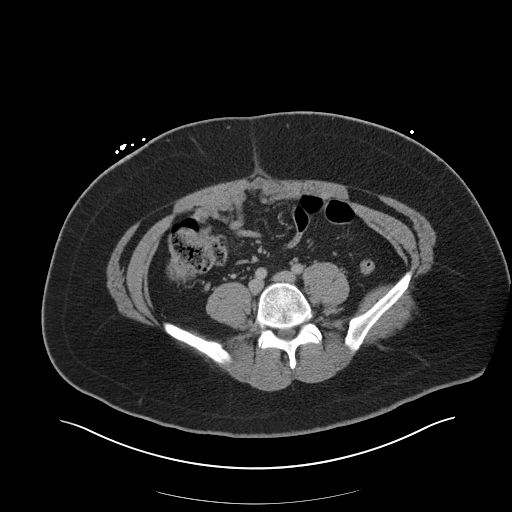
[im 56/100  soft-tissue]
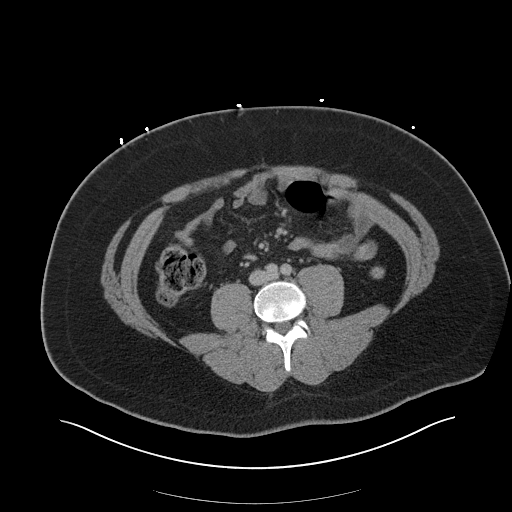
[im 67/100  soft-tissue]
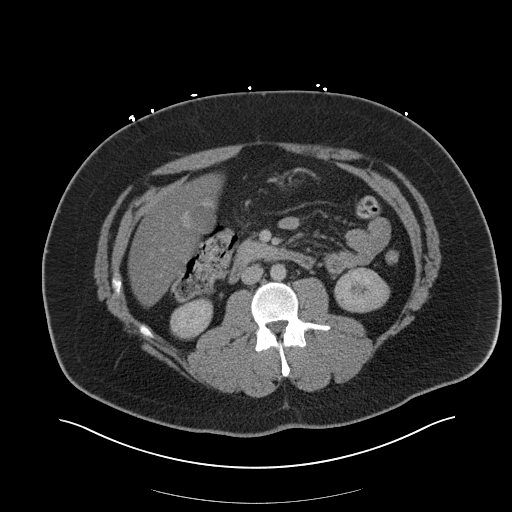
[im 67/100  bone]
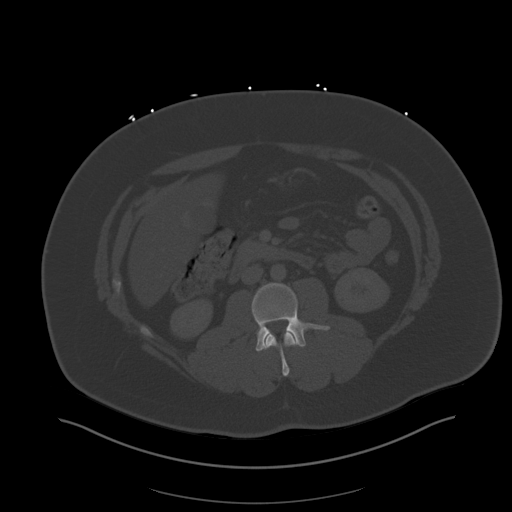
[im 72/100  soft-tissue]
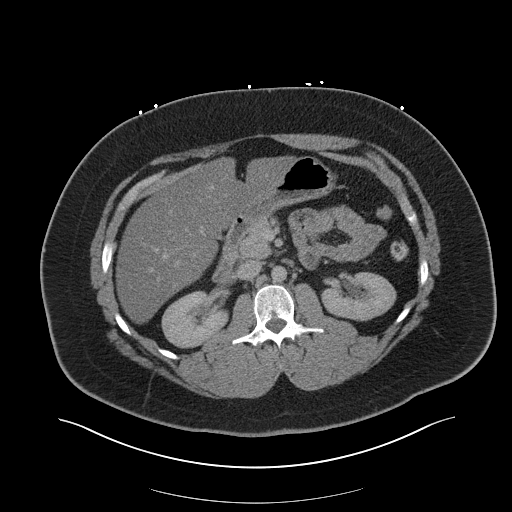
[im 78/100  soft-tissue]
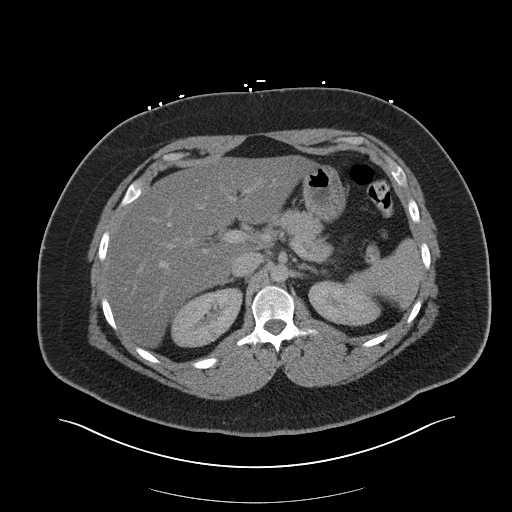
[im 89/100  soft-tissue]
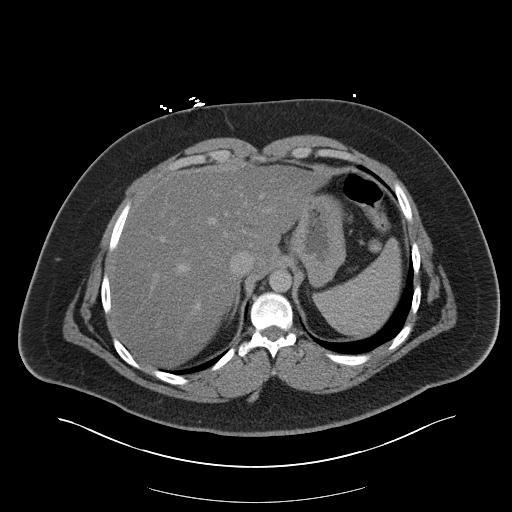
[im 94/100  soft-tissue]
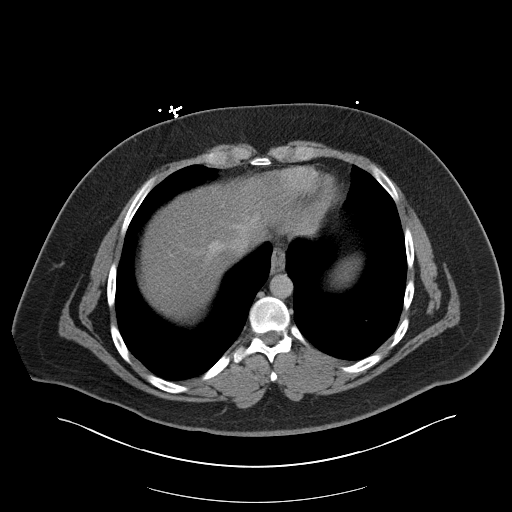

[Series 4: coronal st · coronal · 0.95mm/px · 3 of 105 slices shown]
[im 35/105  soft-tissue]
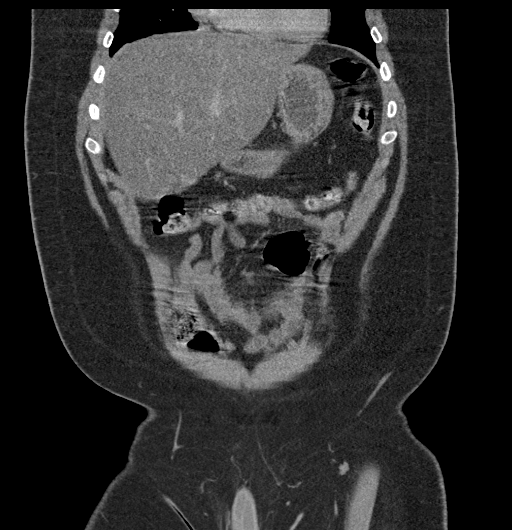
[im 47/105  soft-tissue]
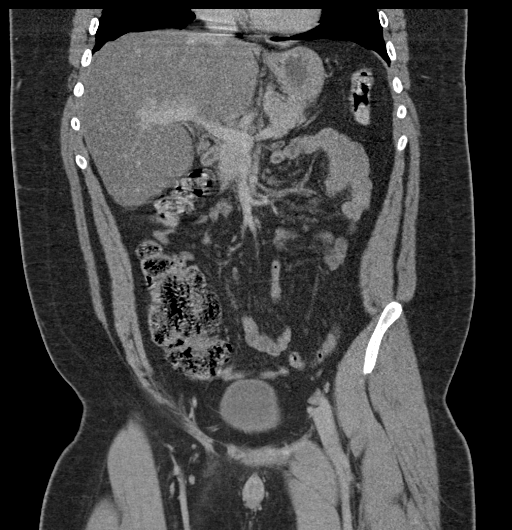
[im 58/105  soft-tissue]
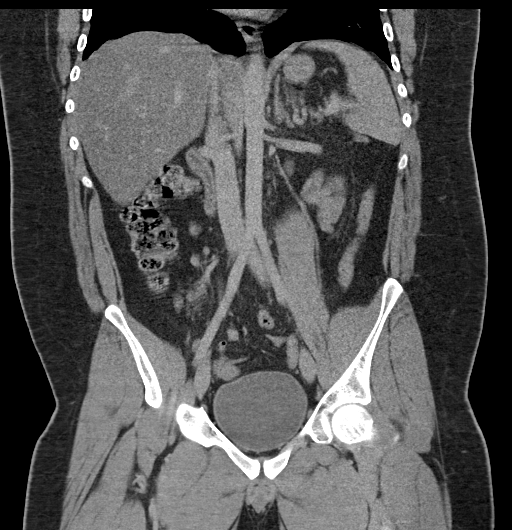

[16 of 46 positions shown; findings below may reference images not displayed]

FINDINGS: Lower chest: Lung bases are clear.

Hepatobiliary: There is hepatic steatosis. No focal liver lesions
are evident. Gallbladder wall is not appreciably thickened. There is
no biliary duct dilatation.

Pancreas: There is no appreciable pancreatic mass or inflammatory
focus.

Spleen: No splenic lesions are evident.

Adrenals/Urinary Tract: Adrenals bilaterally appear normal. Kidneys
bilaterally show no evident mass or hydronephrosis on either side.
There is no appreciable renal or ureteral calculus on either side.
Urinary bladder is midline with wall thickness within normal limits.

Stomach/Bowel: There is no appreciable bowel wall or mesenteric
thickening. No evident bowel obstruction. No free air or portal
venous air. There is moderate stool in the colon. Colon is not
distended with stool.

Vascular/Lymphatic: No abdominal aortic aneurysm. No vascular
lesions are evident. There is no appreciable adenopathy in the
abdomen or pelvis.

Reproductive: Prostate and seminal vesicles are normal in size and
contour. No evident pelvic mass.

Other: Appendix appears normal. There is no abscess or ascites
appreciable in the abdomen or pelvis. There is a minimal ventral
hernia containing only fat.

Musculoskeletal: No blastic or lytic bone lesions. No intramuscular
or abdominal wall lesions are evident.
IMPRESSION: 1. No evident bowel wall thickening or bowel obstruction. No abscess
in the abdomen pelvis. Appendix appears normal.

2.  Hepatic steatosis without focal liver lesion evident.

3.  No renal or ureteral calculus.  No hydronephrosis.

4.  Rather minimal ventral hernia containing only fat.

## 2019-10-19 ENCOUNTER — Other Ambulatory Visit: Payer: Self-pay

## 2019-10-19 DIAGNOSIS — Z20822 Contact with and (suspected) exposure to covid-19: Secondary | ICD-10-CM

## 2019-10-20 LAB — NOVEL CORONAVIRUS, NAA: SARS-CoV-2, NAA: NOT DETECTED
